# Patient Record
Sex: Female | Born: 1985 | Race: Black or African American | Hispanic: No | Smoking: Current every day smoker
Health system: Southern US, Community
[De-identification: ages and names within clinical notes are randomized; demographics above are authoritative.]

## PROBLEM LIST (undated history)

## (undated) DIAGNOSIS — T7840XA Allergy, unspecified, initial encounter: Secondary | ICD-10-CM

## (undated) DIAGNOSIS — F32A Depression, unspecified: Secondary | ICD-10-CM

## (undated) DIAGNOSIS — K219 Gastro-esophageal reflux disease without esophagitis: Secondary | ICD-10-CM

## (undated) DIAGNOSIS — F419 Anxiety disorder, unspecified: Secondary | ICD-10-CM

## (undated) DIAGNOSIS — F329 Major depressive disorder, single episode, unspecified: Secondary | ICD-10-CM

## (undated) HISTORY — DX: Major depressive disorder, single episode, unspecified: F32.9

## (undated) HISTORY — DX: Allergy, unspecified, initial encounter: T78.40XA

## (undated) HISTORY — DX: Depression, unspecified: F32.A

## (undated) HISTORY — DX: Anxiety disorder, unspecified: F41.9

## (undated) HISTORY — PX: OTHER SURGICAL HISTORY: SHX169

## (undated) HISTORY — DX: Gastro-esophageal reflux disease without esophagitis: K21.9

---
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
## 2007-03-03 LAB — HM PAP SMEAR

## 2011-02-17 ENCOUNTER — Ambulatory Visit: Payer: Self-pay

## 2011-02-17 DIAGNOSIS — N926 Irregular menstruation, unspecified: Secondary | ICD-10-CM

## 2011-02-17 DIAGNOSIS — F411 Generalized anxiety disorder: Secondary | ICD-10-CM

## 2011-02-21 ENCOUNTER — Encounter: Payer: Self-pay | Admitting: Physician Assistant

## 2011-02-21 DIAGNOSIS — F329 Major depressive disorder, single episode, unspecified: Secondary | ICD-10-CM | POA: Insufficient documentation

## 2011-03-18 ENCOUNTER — Telehealth: Payer: Self-pay

## 2011-03-18 NOTE — Telephone Encounter (Signed)
.  [facility] Michele Barker - pt states she is having panic attacks when coming down from the meds at the end of day.  She is taking adderol 2 times a day.  Please call  [phone]

## 2011-03-19 NOTE — Telephone Encounter (Signed)
Pt to call in 1 month with progress.

## 2011-03-19 NOTE — Telephone Encounter (Signed)
Spoke with pt.  Doing really well during the day with adderall at 8am and 12 noon but around 4pm she develops panic and has to take an ativan.  Less problems on the weekend because not at work.  Will increase Adderall to tid to see if panic resolves in the afternoon.

## 2011-03-27 ENCOUNTER — Encounter (HOSPITAL_COMMUNITY): Payer: Self-pay | Admitting: Emergency Medicine

## 2011-03-27 ENCOUNTER — Emergency Department (HOSPITAL_COMMUNITY)
Admission: EM | Admit: 2011-03-27 | Discharge: 2011-03-27 | Disposition: A | Discharge status: Home / Self Care | Payer: Self-pay | Attending: Emergency Medicine | Admitting: Emergency Medicine

## 2011-03-27 DIAGNOSIS — R220 Localized swelling, mass and lump, head: Secondary | ICD-10-CM | POA: Insufficient documentation

## 2011-03-27 DIAGNOSIS — F341 Dysthymic disorder: Secondary | ICD-10-CM | POA: Insufficient documentation

## 2011-03-27 DIAGNOSIS — J45909 Unspecified asthma, uncomplicated: Secondary | ICD-10-CM | POA: Insufficient documentation

## 2011-03-27 DIAGNOSIS — K219 Gastro-esophageal reflux disease without esophagitis: Secondary | ICD-10-CM | POA: Insufficient documentation

## 2011-03-27 DIAGNOSIS — Z79899 Other long term (current) drug therapy: Secondary | ICD-10-CM | POA: Insufficient documentation

## 2011-03-27 DIAGNOSIS — T7840XA Allergy, unspecified, initial encounter: Secondary | ICD-10-CM

## 2011-03-27 MED ORDER — DIPHENHYDRAMINE HCL 25 MG PO CAPS
25.0000 mg | ORAL_CAPSULE | Freq: Once | ORAL | Status: AC
  Administered 2011-03-27: 25 mg via ORAL
  Filled 2011-03-27: qty 1

## 2011-03-27 NOTE — ED Notes (Signed)
Pt states she woke up about an hour ago with hives and swelling noted to her lips  Pt states when she first woke up she had some shortness of breath  Pt states she has asthma and used her inhaler with relief  Pt denies difficulty swallowing or breathing at this time

## 2011-03-27 NOTE — ED Notes (Signed)
Pt states she was started on mouthwash periomed yesterday and used it for the first time last night prior to going to bed

## 2011-03-27 NOTE — ED Provider Notes (Signed)
History    26 year old female with a presumedallergic reaction. Before patient went to bed she used a PerioMed for the first time.  she was just prescribed this by her dentist.When went to sleep felt fine and then woke up and she felt like she her lower lip was swelling and her throat felt tight. Has a history of asthma and used her inhaler. Symptoms did  improve somewhat but after reading med instructions she she presented to the emergency department. Noticed a faint rash on her upper chest which has since resolved. No other complaints at this time. No difficulty swallowing. No nausea or vomiting.   CSN: [id number]  Arrival date & time 03/27/11  4098   First MD Initiated Contact with Patient 03/27/11 0402      Chief Complaint  Patient presents with  . Allergic Reaction    (Consider location/radiation/quality/duration/timing/severity/associated sxs/prior treatment) HPI  Past Medical History  Diagnosis Date  . GERD (gastroesophageal reflux disease)   . Anxiety and depression   . Asthma     Past Surgical History  Procedure Date  . Extraction of wisdom teeth     Family History  Problem Relation Age of Onset  . Depression Mother   . Hypothyroidism Mother   . Heart disease Father   . Coronary artery disease Other     History  Substance Use Topics  . Smoking status: Current Everyday Smoker -- 1.0 packs/day    Types: Cigarettes  . Smokeless tobacco: Not on file  . Alcohol Use: Yes     rare    OB History    Grav Para Term Preterm Abortions TAB SAB Ect Mult Living                  Review of Systems   Review of symptoms negative unless otherwise noted in HPI.   Allergies  Review of patient's allergies indicates no known allergies.  Home Medications   Current Outpatient Rx  Name Route Sig Dispense Refill  . ALBUTEROL SULFATE HFA 108 (90 BASE) MCG/ACT IN AERS Inhalation Inhale 2 puffs into the lungs every 6 (six) hours as needed.      .  AMPHETAMINE-DEXTROAMPHETAMINE 30 MG PO TABS Oral Take 30 mg by mouth 3 (three) times daily.     Marland Kitchen CITALOPRAM HYDROBROMIDE 40 MG PO TABS Oral Take 40 mg by mouth daily.      Marland Kitchen FEXOFENADINE HCL 180 MG PO TABS Oral Take 180 mg by mouth daily.    Marland Kitchen HYDROCODONE-ACETAMINOPHEN 5-500 MG PO TABS Oral Take 1 tablet by mouth every 6 (six) hours as needed. pain    . IMIPRAMINE HCL 50 MG PO TABS Oral Take 200 mg by mouth at bedtime.     Marland Kitchen LORAZEPAM 1 MG PO TABS Oral Take 1 mg by mouth every 6 (six) hours as needed. anxiety    . NORGESTIMATE-ETH ESTRADIOL 0.25-35 MG-MCG PO TABS Oral Take 1 tablet by mouth daily.      Marland Kitchen OMEPRAZOLE 40 MG PO CPDR Oral Take 40 mg by mouth daily as needed. gerd    . STANNOUS FLUORIDE 0.63 % MT CONC Mouth/Throat Use as directed 30 mLs in the mouth or throat at bedtime as needed. Mouth rinse      BP 148/93  Pulse 112  Temp(Src) 98.7 F (37.1 C) (Oral)  Resp 20  SpO2 99%  LMP 02/24/2011  Physical Exam  Nursing note and vitals reviewed. Constitutional: She appears well-developed. No distress.       Sitting  up in bed. No acute distress. Obese.  HENT:  Head: Normocephalic and atraumatic.  Mouth/Throat: Oropharynx is clear and moist.       Possibly mild swelling of the lower lip. Patient is handling her secretions. Posterior pharynx grossly normal in appearance. No stridor.  Eyes: Conjunctivae are normal. Right eye exhibits no discharge. Left eye exhibits no discharge.  Neck: Neck supple.  Cardiovascular: Normal rate, regular rhythm and normal heart sounds.  Exam reveals no gallop and no friction rub.   No murmur heard. Pulmonary/Chest: Effort normal and breath sounds normal. No respiratory distress. She has no wheezes.  Abdominal: Soft. She exhibits no distension. There is no tenderness.  Musculoskeletal: She exhibits no edema and no tenderness.  Neurological: She is alert.  Skin: Skin is warm and dry. She is not diaphoretic.  Psychiatric: She has a normal mood and  affect. Her behavior is normal. Thought content normal.    ED Course  Procedures (including critical care time)  Labs Reviewed - No data to display No results found.   1. Allergic reaction     5:05 AM Patient was reexamined. She has no new complaints. She states that she feels like her symptoms have been stable. Repeat examination has not significantly changed from initial.  MDM  26 year old female with a mild likely allergic reaction. There is no evidence respiratory distress. Patient was observed for a period of time in the emergency room without progression symptoms. She is hemodynamically stable. Discussed the need to stop taking this medication and discuss it with her prescribing provider. Return precautions discussed as well. Outpatient followup.        Raeford Razor, MD 03/27/11 [phone]

## 2011-03-31 ENCOUNTER — Telehealth: Payer: Self-pay

## 2011-03-31 NOTE — Telephone Encounter (Signed)
.  [facility]    PT IS REQUESTING REFILL FOR ADDERALL,ALSO HAS QUESTIONS  RE OTHER MEDS.   BEST PHONE [phone]

## 2011-04-01 NOTE — Telephone Encounter (Signed)
SPOKE WITH PT AND HER ADDERALL WAS INCREASED TO TID BUT WHEN THIS WAS DONE SHE STATRED GETTING PANIC ATTACKS AT THE END OF THE DAY. CAN SHE HAVE A REFILL ON HER ATIVAN TO HAVE IN CASE SHE CONTINUES TO HAVE THESE? PLEASE ADVISE

## 2011-04-02 NOTE — Telephone Encounter (Signed)
Please clarify - When I spoke with her 1/29 this was the problem she was having taking Adderall 2x/d so if she is still having the same problem with Adderall 3x/d maybe we should go to Adderall 2x/d and Klonopin at afternoon/at night?  I am happy to write her for her meds I am just trying to figure out what the best regimen is.

## 2011-04-02 NOTE — Telephone Encounter (Signed)
Spoke with pt and discussed Sarah's suggestion. Pt stated that the last couple of days she has not had any more panic attacks and seems to be fine on the 3 Adderalls a day without needing Ativan. Pt agrees to CB if she has any more problems, but she thinks she just had to adjust to the new dosage.

## 2011-04-02 NOTE — Telephone Encounter (Signed)
That is great news

## 2011-04-07 ENCOUNTER — Telehealth: Payer: Self-pay

## 2011-04-07 MED ORDER — AMPHETAMINE-DEXTROAMPHETAMINE 30 MG PO TABS: 30.0000 mg | ORAL_TABLET | Freq: Three times a day (TID) | ORAL | Status: DC

## 2011-04-07 NOTE — Telephone Encounter (Signed)
Written - will be up at TL desk signed.

## 2011-04-07 NOTE — Telephone Encounter (Signed)
.  [facility]    PT IS REQUESTING ADDERALL REFILL ,STATES SHE HAS REQUESTED MANY TIMES.    BEST PHONE  [phone]

## 2011-04-07 NOTE — Telephone Encounter (Signed)
Patient notified and rx in p/u drawer. 

## 2011-04-07 NOTE — Telephone Encounter (Signed)
Spoke with pt and apologized we didn't get Adderall Rx written last week when we d//w pt not needing ativan anymore. She was understanding, but has been out of adderall since Fri so would like to get the RF asap. Verified with pt that she is now taking Adderall 30 TID.

## 2011-05-01 ENCOUNTER — Telehealth: Payer: Self-pay

## 2011-05-01 NOTE — Telephone Encounter (Signed)
pls pull chart and send msg to pa pool

## 2011-05-01 NOTE — Telephone Encounter (Signed)
Chart pulled to PA 

## 2011-05-01 NOTE — Telephone Encounter (Signed)
.  [facility] PT IN NEED OF HER ADDERALL. PLEASE CALL [phone] WHEN READY

## 2011-05-02 MED ORDER — AMPHETAMINE-DEXTROAMPHETAMINE 30 MG PO TABS: 30.0000 mg | ORAL_TABLET | Freq: Three times a day (TID) | ORAL | Status: DC

## 2011-05-02 NOTE — Telephone Encounter (Signed)
Signed at TL desk.  

## 2011-05-02 NOTE — Telephone Encounter (Signed)
Pt notified that rx is ready for pick up

## 2011-05-22 ENCOUNTER — Telehealth: Payer: Self-pay

## 2011-05-22 NOTE — Telephone Encounter (Signed)
Michele Barker   Patient wants to discuss change of medications adavan vs adderall  No money for ov  Please call [phone]

## 2011-05-23 NOTE — Telephone Encounter (Signed)
Get info

## 2011-05-24 ENCOUNTER — Ambulatory Visit (INDEPENDENT_AMBULATORY_CARE_PROVIDER_SITE_OTHER): Payer: Self-pay | Admitting: Family Medicine

## 2011-05-24 VITALS — BP 140/88 | HR 96 | Temp 98.0°F | Resp 16 | Ht 65.75 in | Wt 220.6 lb

## 2011-05-24 DIAGNOSIS — F411 Generalized anxiety disorder: Secondary | ICD-10-CM

## 2011-05-24 DIAGNOSIS — F419 Anxiety disorder, unspecified: Secondary | ICD-10-CM

## 2011-05-24 MED ORDER — LORAZEPAM 1 MG PO TABS: ORAL_TABLET | ORAL | Status: DC

## 2011-05-24 NOTE — Telephone Encounter (Signed)
Spoke with patient, she states that over the last few weeks she has had an increase in stress both at work and in her personal life.  She would like to change back to Ativan to help with this and stop Adderall.  She is not able to RTC due to cost, can we rx this for her?

## 2011-05-24 NOTE — Telephone Encounter (Signed)
Needs OV (SW out of office on maternity leave)

## 2011-05-24 NOTE — Telephone Encounter (Signed)
Can we do acct agreement?

## 2011-05-24 NOTE — Telephone Encounter (Signed)
LMOM advising patient to RTC. 

## 2011-05-24 NOTE — Progress Notes (Signed)
Patient Name: Michele Barker Date of Birth: 06/08/1985 Medical Record Number: [id number] Gender: female Date of Encounter: 05/24/2011  History of Present Illness:  Michele Barker is a 26 y.o. very pleasant female patient who presents with the following:  Here to discuss her anxiety- she started adderall in December.  She felt that she was doing ok, but then over the last couple of weeks her anxiety has returned and she feels that her work pressures and home pressures are overwhelming her.  Even prior to this the adderall seemed to make her feel anxious- but it did help her do her job and concentrate.  She notes that she has cried a lot over the last couple of weeks.  She does have a history of depression that seemed to start when her father died a couple of years ago.    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    She has been on clonazepam in the past, also tried wellbutrin but she did not do well with this.  Ativan has helped her with depression and being "overly emotional."  She also has used celexa for some time  She is currently not on ativan at all- in the past she had been taking up to 4 pills daily.   She has no SI.  Not sleeping well- takes a sleep medication  Has menses currently  Patient Active Problem List  Diagnoses  . Anxiety and depression   Past Medical History  Diagnosis Date  . GERD (gastroesophageal reflux disease)   . Anxiety and depression   . Asthma    Past Surgical History  Procedure Date  . Extraction of wisdom teeth    History  Substance Use Topics  . Smoking status: Current Everyday Smoker -- 1.0 packs/day    Types: Cigarettes  . Smokeless tobacco: Not on file  . Alcohol Use: Yes     rare   Family History  Problem Relation Age of Onset  . Depression Mother   . Hypothyroidism Mother   . Heart disease Father   . Coronary artery disease Other    No Known Allergies  Medication list has been reviewed  and updated.  Review of Systems: As per HPI- otherwise negative.   Physical Examination: Filed Vitals:   05/24/11 1754  BP: 140/88  Pulse: 96  Temp: 98 F (36.7 C)  TempSrc: Oral  Resp: 16  Height: 5' 5.75" (1.67 m)  Weight: 220 lb 9.6 oz (100.064 kg)    Body mass index is 35.88 kg/(m^2).  GEN: WDWN, NAD, Non-toxic, A & O x 3, obese HEENT: Atraumatic, Normocephalic. Neck supple. No masses, No LAD. Ears and Nose: No external deformity. CV: RRR, No M/G/R. No JVD. No thrill. No extra heart sounds. PULM: CTA B, no wheezes, crackles, rhonchi. No retractions. No resp. distress. No accessory muscle use. EXTR: No c/c/e NEURO Normal gait.  PSYCH: Normally interactive. Conversant. Not depressed or anxious appearing.  Calm and pleasant demeanor.    Assessment and Plan: 1. Anxiety  LORazepam (ATIVAN) 1 MG tablet   Will start back on ativan for anxiety, but will try and use a lesser dose than she had in the past if possible.  Start one mg, 1/2 or 1 TID.  Let us know how she is doing in the next few weeks. She plans to stop taking adderall as this seems to make her feel worse.  Call if any other problems- otherwise may follow- up  by phone.

## 2011-05-24 NOTE — Telephone Encounter (Signed)
Yes, please offer account agreement so pt can be seen.

## 2011-05-30 ENCOUNTER — Telehealth: Payer: Self-pay

## 2011-05-30 NOTE — Telephone Encounter (Signed)
Pt in office last weekend and would like to see if it's ok with Dr Patsy Lager to take aderrall during the day and adavan in the evening plus she wants to stop taking her sleep medication and just use the adavan she says that it's working and wakes up happy.

## 2011-06-02 MED ORDER — AMPHETAMINE-DEXTROAMPHETAMINE 30 MG PO TABS: 30.0000 mg | ORAL_TABLET | Freq: Three times a day (TID) | ORAL | Status: DC

## 2011-06-02 NOTE — Telephone Encounter (Signed)
Please call patient- she can probably use whichever combination seems to help her.  I do not really understand this message- please get details.  Thanks!

## 2011-06-02 NOTE — Telephone Encounter (Signed)
PT IN NEED OF HER ADDERALL. PLEASE CALL [phone]

## 2011-06-02 NOTE — Telephone Encounter (Signed)
Notified pt Rx is ready for p/up, and clarified that the pt wanted to let Dr Patsy Lager know that the Ativan is working well for her for sleep and not needing sleep med too.

## 2011-06-02 NOTE — Telephone Encounter (Signed)
Rx printed

## 2011-06-19 ENCOUNTER — Other Ambulatory Visit: Payer: Self-pay | Admitting: Physician Assistant

## 2011-06-20 ENCOUNTER — Ambulatory Visit: Payer: Self-pay | Admitting: Internal Medicine

## 2011-06-20 VITALS — BP 124/86 | HR 112 | Temp 98.2°F | Resp 16 | Ht 66.0 in | Wt 216.0 lb

## 2011-06-20 DIAGNOSIS — F419 Anxiety disorder, unspecified: Secondary | ICD-10-CM

## 2011-06-20 DIAGNOSIS — J9801 Acute bronchospasm: Secondary | ICD-10-CM

## 2011-06-20 MED ORDER — PREDNISONE 20 MG PO TABS: ORAL_TABLET | ORAL | Status: DC

## 2011-06-20 MED ORDER — LORAZEPAM 1 MG PO TABS: ORAL_TABLET | ORAL | Status: DC

## 2011-06-20 MED ORDER — AMOXICILLIN 875 MG PO TABS: 875.0000 mg | ORAL_TABLET | Freq: Two times a day (BID) | ORAL | Status: AC

## 2011-06-20 MED ORDER — ALBUTEROL SULFATE HFA 108 (90 BASE) MCG/ACT IN AERS: 2.0000 | INHALATION_SPRAY | RESPIRATORY_TRACT | Status: DC | PRN

## 2011-06-20 NOTE — Progress Notes (Signed)
  Subjective:    Patient ID: Michele Barker, female    DOB: 05/09/1985, 26 y.o.   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 albuterol (PROAIR HFA) 108 (90 BASE) MCG/ACT inhaler    Sig: Inhale 2 puffs into the lungs every 4 (four) hours as needed for wheezing.    Dispense:  1 Inhaler    Refill:  5  . LORazepam (ATIVAN) 1 MG tablet    Sig: Take 1/2 or one tablet TID as needed for anxiety    Dispense:  90 tablet    Refill:  2  . predniSONE (DELTASONE) 20 MG tablet    Sig: 4/3/3/2/2/1/1 single daily dose for 7 days    Dispense:  16 tablet    Refill:  0  . amoxicillin (AMOXIL) 875 MG tablet    Sig: Take 1 tablet (875 mg total) by mouth 2 (two) times daily.    Dispense:  20 tablet    Refill:  0   She will hold the amoxicillin and not use it unless her status infection develops over the next 4 or 5 days as her allergy responds to prednisone/Continue Allegra/stop smoking

## 2011-06-27 ENCOUNTER — Telehealth: Payer: Self-pay

## 2011-06-27 MED ORDER — AMPHETAMINE-DEXTROAMPHETAMINE 30 MG PO TABS: 30.0000 mg | ORAL_TABLET | Freq: Three times a day (TID) | ORAL | Status: DC

## 2011-06-27 NOTE — Telephone Encounter (Signed)
Spoke with patient and let her know that rx was ready for pick up.

## 2011-06-27 NOTE — Telephone Encounter (Signed)
Pt would like a refill on adderall. 

## 2011-06-27 NOTE — Telephone Encounter (Signed)
Done and ready for pick up. May fill on 07/02/11

## 2011-07-29 ENCOUNTER — Telehealth: Payer: Self-pay

## 2011-07-29 NOTE — Telephone Encounter (Signed)
PT IN NEED OF HER ADDERALL. PLEASE CALL [phone] WHEN READY FOR P/U

## 2011-07-30 MED ORDER — AMPHETAMINE-DEXTROAMPHETAMINE 30 MG PO TABS: 30.0000 mg | ORAL_TABLET | Freq: Three times a day (TID) | ORAL | Status: DC

## 2011-07-30 NOTE — Telephone Encounter (Signed)
rx is ready

## 2011-07-30 NOTE — Telephone Encounter (Signed)
Left message on machine that medicine is ready to be picked up

## 2011-08-19 ENCOUNTER — Telehealth: Payer: Self-pay

## 2011-08-19 MED ORDER — NORGESTIMATE-ETH ESTRADIOL 0.25-35 MG-MCG PO TABS: 1.0000 | ORAL_TABLET | Freq: Every day | ORAL | Status: DC

## 2011-08-19 NOTE — Telephone Encounter (Signed)
Spoke with pt and advised that rx sent in and will need ov.  Pt understood and will be coming in.

## 2011-08-19 NOTE — Telephone Encounter (Signed)
The patient called in to request refill of BCP Sprintec be called in to her pharmacy today, since she has no pill for today and the pharmacy stated she has no refills left.  The patient uses the [facility] pharmacy on [address].  Please call in Rx and call patient at  [phone] once the Rx has been called in to the pharmacy.

## 2011-08-19 NOTE — Telephone Encounter (Signed)
Done and sent in. Needs ov

## 2011-08-31 ENCOUNTER — Ambulatory Visit: Payer: Self-pay | Admitting: Family Medicine

## 2011-08-31 ENCOUNTER — Other Ambulatory Visit: Payer: Self-pay | Admitting: Physician Assistant

## 2011-08-31 VITALS — BP 135/85 | HR 81 | Temp 98.3°F | Resp 18 | Ht 66.0 in | Wt 219.6 lb

## 2011-08-31 DIAGNOSIS — Z3009 Encounter for other general counseling and advice on contraception: Secondary | ICD-10-CM

## 2011-08-31 DIAGNOSIS — J45909 Unspecified asthma, uncomplicated: Secondary | ICD-10-CM

## 2011-08-31 DIAGNOSIS — F988 Other specified behavioral and emotional disorders with onset usually occurring in childhood and adolescence: Secondary | ICD-10-CM

## 2011-08-31 MED ORDER — MOMETASONE FURO-FORMOTEROL FUM 100-5 MCG/ACT IN AERO: 2.0000 | INHALATION_SPRAY | Freq: Two times a day (BID) | RESPIRATORY_TRACT | Status: DC

## 2011-08-31 MED ORDER — PREDNISONE 20 MG PO TABS: ORAL_TABLET | ORAL | Status: DC

## 2011-08-31 MED ORDER — ALBUTEROL SULFATE HFA 108 (90 BASE) MCG/ACT IN AERS: 2.0000 | INHALATION_SPRAY | RESPIRATORY_TRACT | Status: DC | PRN

## 2011-08-31 MED ORDER — NORGESTIMATE-ETH ESTRADIOL 0.25-35 MG-MCG PO TABS: 1.0000 | ORAL_TABLET | Freq: Every day | ORAL | Status: DC

## 2011-08-31 MED ORDER — AMPHETAMINE-DEXTROAMPHETAMINE 30 MG PO TABS: 30.0000 mg | ORAL_TABLET | Freq: Three times a day (TID) | ORAL | Status: DC

## 2011-08-31 NOTE — Patient Instructions (Signed)
Return to the clinic or go to the nearest emergency room if any of your symptoms worsen or new symptoms occur. Schedule office visit with Michele Barker in the next few months.

## 2011-08-31 NOTE — Progress Notes (Signed)
  Subjective:    Patient ID: Michele Barker, female    DOB: 03-May-1985, 26 y.o.   MRN: [id number]  HPI Michele Barker is a 26 y.o. female Hx of asthma - on proair - most days once per day, then with flairs - uses up to every 4 hours -using every 4 hours for past 5 days.  Ran out of inhaler this am - wheezing and shortness of breath.  No fever.  Just wheeze, cough with clear sputum, allergies flared up.   Taking allegra otc QD.  Prior on Qvar - stopped over a year ago as felt like didn't need it. ON prednisone few months ago with last flair.  No hx of psychosis or anxiety changes with prednisone.   Out of Adderall - takes 30mg  tid. Last filled over a month. No extra doses. Working well.  Appetite ok recently. No insomnia.    About to be out of birth control.  Last pap in 2009, but plans on physical in next few months.  Unable to afford physical, or pap testing today. Needs rx Sprintec.  No missed doses.   Usually sees Benny Lennert, does not have appt setup.    Lost job June 14th.   Working on quitting smoking - less than 5 cigarettes per day.   Review of Systems  Constitutional: Negative for fever and chills.  Respiratory: Positive for cough (clear sputum.), shortness of breath and wheezing (worse this am - panicking  - unable to breathe. ).       Objective:   Physical Exam  Constitutional: She is oriented to person, place, and time. She appears well-developed and well-nourished. No distress.  HENT:  Head: Normocephalic and atraumatic.  Right Ear: Hearing, tympanic membrane, external ear and ear canal normal.  Left Ear: Hearing, tympanic membrane, external ear and ear canal normal.  Nose: Nose normal.  Mouth/Throat: Oropharynx is clear and moist. No oropharyngeal exudate.  Eyes: Conjunctivae and EOM are normal. Pupils are equal, round, and reactive to light.  Cardiovascular: Normal rate, regular rhythm, normal heart sounds and intact distal pulses.   No murmur heard. Pulmonary/Chest:  Effort normal. No respiratory distress. She has no decreased breath sounds. She has wheezes. She has no rhonchi. She has no rales.       Normal effort, no distress,  Few scattered end expiratory wheezes.     Neurological: She is alert and oriented to person, place, and time.  Skin: Skin is warm and dry. No rash noted.  Psychiatric: She has a normal mood and affect. Her behavior is normal.          Assessment & Plan:  Michele Barker is a 26 y.o. female 1. Asthma   2. ADD (attention deficit disorder)   3. Counseling for birth control, oral contraceptives     Asthma - uncontrolled with recent flairs, and continued use of albuterol.  Prednisone 20mg  - 2 QD for 5 days. Start Dulera 100/5 - 2 puffs BID.  # 1 rx, 1 refill.  Coupon given for initial Dulera, but then if cost prohibitive for refill can change to Qvar (coupon also given).  RTC precautions. SED of prednisone discusssed.  Understanding expressed.  ADD - controlled.  Continue adderall 30mg  TID - ok to refill x 2 then ov with Benny Lennert, PA-C.   Contraceptive counseling - refilled sprintec for 6 months, but stressed importance of pap testing - plans to schedule next few months.

## 2011-09-20 ENCOUNTER — Other Ambulatory Visit: Payer: Self-pay | Admitting: Physician Assistant

## 2011-09-30 ENCOUNTER — Telehealth: Payer: Self-pay

## 2011-09-30 MED ORDER — AMPHETAMINE-DEXTROAMPHETAMINE 30 MG PO TABS: 30.0000 mg | ORAL_TABLET | Freq: Three times a day (TID) | ORAL | Status: DC

## 2011-09-30 NOTE — Telephone Encounter (Signed)
Pt requesting adderall refill   Best phone [phone]

## 2011-09-30 NOTE — Telephone Encounter (Signed)
Done and printed

## 2011-09-30 NOTE — Telephone Encounter (Signed)
Left message that rx ready for pick up and at front desk.

## 2011-11-03 ENCOUNTER — Telehealth: Payer: Self-pay

## 2011-11-03 MED ORDER — AMPHETAMINE-DEXTROAMPHETAMINE 30 MG PO TABS: 30.0000 mg | ORAL_TABLET | Freq: Three times a day (TID) | ORAL | Status: DC

## 2011-11-03 NOTE — Telephone Encounter (Signed)
PT IN NEED OF HER ADDERALL. PLEASE CALL [phone] WHEN READY

## 2011-11-03 NOTE — Telephone Encounter (Signed)
ADD - controlled. Continue adderall 30mg  TID - ok to refill x 2 then ov with Benny Lennert, PA-C. This was in July when pt was seen for other issues, patient now due, need to advise patient.

## 2011-11-03 NOTE — Telephone Encounter (Signed)
Rx done and ready to pickup.  She may call for 1 more and then needs follow up OV with Maralyn Sago.

## 2011-11-03 NOTE — Telephone Encounter (Signed)
Patient notified and rx in pickup drawer. 

## 2011-11-04 ENCOUNTER — Other Ambulatory Visit: Payer: Self-pay | Admitting: Family Medicine

## 2011-11-04 DIAGNOSIS — F419 Anxiety disorder, unspecified: Secondary | ICD-10-CM

## 2011-11-04 MED ORDER — LORAZEPAM 1 MG PO TABS: ORAL_TABLET | ORAL | Status: DC

## 2011-11-26 ENCOUNTER — Ambulatory Visit: Payer: BC Managed Care – PPO | Admitting: Family Medicine

## 2011-11-26 VITALS — BP 110/78 | HR 88 | Temp 98.7°F | Resp 16 | Ht 66.3 in | Wt 237.6 lb

## 2011-11-26 DIAGNOSIS — J40 Bronchitis, not specified as acute or chronic: Secondary | ICD-10-CM

## 2011-11-26 MED ORDER — CEFDINIR 300 MG PO CAPS: 300.0000 mg | ORAL_CAPSULE | Freq: Two times a day (BID) | ORAL | Status: DC

## 2011-11-26 MED ORDER — ALBUTEROL SULFATE HFA 108 (90 BASE) MCG/ACT IN AERS: 2.0000 | INHALATION_SPRAY | RESPIRATORY_TRACT | Status: DC | PRN

## 2011-11-26 MED ORDER — HYDROCODONE-HOMATROPINE 5-1.5 MG/5ML PO SYRP: 5.0000 mL | ORAL_SOLUTION | Freq: Three times a day (TID) | ORAL | Status: DC | PRN

## 2011-11-26 NOTE — Progress Notes (Signed)
[facility] [address] [place] [address] [phone]  Date:  11/26/2011   Name:  Michele Barker   DOB:  12-Jul-1985   MRN:  [id number]  PCP:  Virgilio Belling    Chief Complaint: Bronchitis and Medication Refill   History of Present Illness:  Michele Barker is a 26 y.o. very pleasant female patient who presents with the following:  She had a cold about 3 weeks ago.  Got mostly better, but she continues to have a cough at night that can be painful.  Sometimes she coughs up mucus.  She also has a ST for the last couple of days.   She felt feverish a few days ago- not anymore.   Ears feel full and congested.   No GI symptoms. She does have some body aches, but no chills recently.   She has not slept well because of the cough.    She is generally healthy except she does have some asthma.  However, she has not wheezed in several days.   LMP 11/12/11  Patient Active Problem List  Diagnosis  . Anxiety and depression    Past Medical History  Diagnosis Date  . GERD (gastroesophageal reflux disease)   . Anxiety and depression   . Asthma     Past Surgical History  Procedure Date  . Extraction of wisdom teeth     History  Substance Use Topics  . Smoking status: Current Every Day Smoker -- 1.0 packs/day    Types: Cigarettes  . Smokeless tobacco: Not on file  . Alcohol Use: Yes     rare    Family History  Problem Relation Age of Onset  . Depression Mother   . Hypothyroidism Mother   . Heart disease Father   . Coronary artery disease Other     No Known Allergies  Medication list has been reviewed and updated.  Current Outpatient Prescriptions on File Prior to Visit  Medication Sig Dispense Refill  . albuterol (PROAIR HFA) 108 (90 BASE) MCG/ACT inhaler Inhale 2 puffs into the lungs every 4 (four) hours as needed for wheezing.  1 Inhaler  0  . amphetamine-dextroamphetamine (ADDERALL) 30 MG tablet Take 1 tablet (30 mg total) by mouth 3  (three) times daily.  90 tablet  0  . citalopram (CELEXA) 40 MG tablet TAKE ONE TABLET BY MOUTH ONE TIME DAILY  90 tablet  0  . fexofenadine (ALLEGRA) 180 MG tablet Take 180 mg by mouth daily.      Marland Kitchen LORazepam (ATIVAN) 1 MG tablet Take 1/2 or one tablet TID as needed for anxiety  90 tablet  0  . mometasone-formoterol (DULERA) 100-5 MCG/ACT AERO Inhale 2 puffs into the lungs 2 (two) times daily.  13 g  1  . norgestimate-ethinyl estradiol (ORTHO-CYCLEN,SPRINTEC,PREVIFEM) 0.25-35 MG-MCG tablet Take 1 tablet by mouth daily. Needs office visit  1 Package  5  . omeprazole (PRILOSEC) 40 MG capsule Take 40 mg by mouth daily as needed. gerd      . imipramine (TOFRANIL) 50 MG tablet Take 200 mg by mouth at bedtime.       . predniSONE (DELTASONE) 20 MG tablet 2 pills per day for 5 days.  10 tablet  0    Review of Systems:  As per HPI- otherwise negative.   Physical Examination: Filed Vitals:   11/26/11 1619  BP: 110/78  Pulse: 88  Temp: 98.7 F (37.1 C)  Resp: 16   Filed Vitals:  11/26/11 1619  Height: 5' 6.3" (1.684 m)  Weight: 237 lb 9.6 oz (107.775 kg)   Body mass index is 38.00 kg/(m^2). Ideal Body Weight: Weight in (lb) to have BMI = 25: 156   GEN: WDWN, NAD, Non-toxic, A & O x 3 HEENT: Atraumatic, Normocephalic. Neck supple. No masses, No LAD. Bilateral TM wnl, oropharynx normal.  PEERL,EOMI.   Nasal cavity normal Ears and Nose: No external deformity. CV: RRR, No M/G/R. No JVD. No thrill. No extra heart sounds. PULM: CTA B, no wheezes, crackles, rhonchi. No retractions. No resp. distress. No accessory muscle use. ABD: S, NT, ND, +BS. No rebound. No HSM. EXTR: No c/c/e NEURO Normal gait.  PSYCH: Normally interactive. Conversant. Not depressed or anxious appearing.  Calm demeanor.    Assessment and Plan: 1. Bronchitis  cefdinir (OMNICEF) 300 MG capsule, HYDROcodone-homatropine (HYCODAN) 5-1.5 MG/5ML syrup, albuterol (PROAIR HFA) 108 (90 BASE) MCG/ACT inhaler   Treat for  bronchitis as above.  Avoid combining hycodan with other sedating medications such as ativan.  omnicef as directed.  Be aware that this antibiotic could interfere with her OCP.  Let me know if not better in the next couple of days- Sooner if worse.     Abbe Amsterdam, MD

## 2011-11-29 ENCOUNTER — Ambulatory Visit (INDEPENDENT_AMBULATORY_CARE_PROVIDER_SITE_OTHER): Payer: BC Managed Care – PPO | Admitting: Physician Assistant

## 2011-11-29 VITALS — BP 110/60 | HR 100 | Temp 98.1°F | Resp 16 | Ht 66.18 in | Wt 230.6 lb

## 2011-11-29 DIAGNOSIS — R300 Dysuria: Secondary | ICD-10-CM

## 2011-11-29 DIAGNOSIS — R197 Diarrhea, unspecified: Secondary | ICD-10-CM

## 2011-11-29 DIAGNOSIS — N760 Acute vaginitis: Secondary | ICD-10-CM

## 2011-11-29 DIAGNOSIS — N390 Urinary tract infection, site not specified: Secondary | ICD-10-CM

## 2011-11-29 DIAGNOSIS — N898 Other specified noninflammatory disorders of vagina: Secondary | ICD-10-CM

## 2011-11-29 LAB — POCT URINALYSIS DIPSTICK
Glucose, UA: NEGATIVE
Nitrite, UA: NEGATIVE
Protein, UA: 30
Urobilinogen, UA: 0.2

## 2011-11-29 LAB — POCT UA - MICROSCOPIC ONLY
Casts, Ur, LPF, POC: NEGATIVE
Crystals, Ur, HPF, POC: NEGATIVE
Yeast, UA: NEGATIVE

## 2011-11-29 LAB — POCT WET PREP WITH KOH
KOH Prep POC: NEGATIVE
Trichomonas, UA: NEGATIVE

## 2011-11-29 MED ORDER — METRONIDAZOLE 500 MG PO TABS: 500.0000 mg | ORAL_TABLET | Freq: Two times a day (BID) | ORAL | Status: AC

## 2011-11-29 MED ORDER — PHENAZOPYRIDINE HCL 200 MG PO TABS: 200.0000 mg | ORAL_TABLET | Freq: Three times a day (TID) | ORAL | Status: DC | PRN

## 2011-11-29 MED ORDER — FLUCONAZOLE 150 MG PO TABS: 150.0000 mg | ORAL_TABLET | Freq: Once | ORAL | Status: DC

## 2011-11-29 MED ORDER — SULFAMETHOXAZOLE-TRIMETHOPRIM 800-160 MG PO TABS: 1.0000 | ORAL_TABLET | Freq: Two times a day (BID) | ORAL | Status: AC

## 2011-11-29 NOTE — Progress Notes (Signed)
[address] [place] [address]   Phone [phone]  Subjective:    Patient ID: Michele Barker, female    DOB: [birth year], 26 y.o.   MRN: [id number]  HPI Pt presents to clinic with vaginal irritation, dysuria and diarrhea.  The symptoms all started about the same time after starting Omnicef for bronchitis.  She feels better with her bronchitis.  She has loose frequent stools without blood.  It hurts when she urinates and she is having frequency.  She also is irritated in her vaginal area.  No new sexual contacts.  She is otherwise doing well.  Less stress with her new job.  Still living with her mom but they are getting ready to look for a different apartment.   Review of Systems  Constitutional: Negative.   Gastrointestinal: Positive for diarrhea. Negative for abdominal pain, constipation and blood in stool.  Genitourinary: Positive for dysuria, frequency, vaginal discharge and vaginal pain. Negative for urgency, hematuria and difficulty urinating.       Objective:   Physical Exam  Vitals reviewed. Constitutional: She is oriented to person, place, and time. She appears well-developed and well-nourished.  HENT:  Head: Normocephalic and atraumatic.  Right Ear: External ear normal.  Left Ear: External ear normal.  Eyes: Conjunctivae normal are normal. Pupils are equal, round, and reactive to light.  Cardiovascular: Normal rate, regular rhythm and normal heart sounds.   Pulmonary/Chest: Effort normal. She has wheezes (few at bases only and only end expiration).  Abdominal: Soft. Bowel sounds are normal. She exhibits no distension. There is no tenderness. There is no CVA tenderness.  Genitourinary: Pelvic exam was performed with patient supine. There is rash (mild erythema) on the right labia. There is no tenderness, lesion or injury on the right labia. There is rash (mild erythema) on the left labia. There is no tenderness, lesion or injury on the left labia. Cervix exhibits no  discharge and no friability. No erythema around the vagina. Vaginal discharge (thick white d/c) found.  Neurological: She is alert and oriented to person, place, and time.  Skin: Skin is warm and dry.  Psychiatric: She has a normal mood and affect. Her behavior is normal. Judgment and thought content normal.   Results for orders placed in visit on 11/29/11  POCT UA - MICROSCOPIC ONLY      Component Value Range   WBC, Ur, HPF, POC tntc     RBC, urine, microscopic tntc     Bacteria, U Microscopic 3+     Mucus, UA pos     Epithelial cells, urine per micros 4-7     Crystals, Ur, HPF, POC neg     Casts, Ur, LPF, POC neg     Yeast, UA neg    POCT URINALYSIS DIPSTICK      Component Value Range   Color, UA yellow     Clarity, UA cloudy     Glucose, UA neg     Bilirubin, UA neg     Ketones, UA 15     Spec Grav, UA >=1.030     Blood, UA trace     pH, UA 6.0     Protein, UA 30     Urobilinogen, UA 0.2     Nitrite, UA neg     Leukocytes, UA Trace    POCT WET PREP WITH KOH      Component Value Range   Trichomonas, UA Negative     Clue Cells Wet Prep HPF POC tntc  Epithelial Wet Prep HPF POC neg     Yeast Wet Prep HPF POC neg     Bacteria Wet Prep HPF POC 3+     RBC Wet Prep HPF POC 3-6     WBC Wet Prep HPF POC tntc     KOH Prep POC Negative          Assessment & Plan:   1. Dysuria  POCT UA - Microscopic Only, POCT urinalysis dipstick, POCT Wet Prep with KOH  2. Vaginal discharge  fluconazole (DIFLUCAN) 150 MG tablet  3. Diarrhea    4. UTI (lower urinary tract infection)  sulfamethoxazole-trimethoprim (BACTRIM DS,SEPTRA DS) 800-160 MG per tablet, phenazopyridine (PYRIDIUM) 200 MG tablet, fluconazole (DIFLUCAN) 150 MG tablet, Urine culture  5. BV (bacterial vaginosis)  metroNIDAZOLE (FLAGYL) 500 MG tablet   1-  2- todays vaginal irritation is from BV but concerned with abx use pt will develop yeast vaginitis so will send pt with a rx for treatment. 3- SE from [place].  D/w  pt that she may get some with new abx also. 4- treat with abx - push fluids 5- treat with abx  Due to pts SE of diarrhea from [facility] and the general poor coverage for UTI will switch to Septra DS which should cover UTI and bronchitis.  D/w pt possible SE and she agreed to the change in abx.

## 2011-11-30 LAB — URINE CULTURE: Colony Count: 1000

## 2011-12-04 ENCOUNTER — Ambulatory Visit (INDEPENDENT_AMBULATORY_CARE_PROVIDER_SITE_OTHER): Payer: BC Managed Care – PPO | Admitting: Emergency Medicine

## 2011-12-04 VITALS — BP 124/82 | HR 98 | Temp 98.2°F | Resp 18 | Ht 65.5 in | Wt 223.4 lb

## 2011-12-04 DIAGNOSIS — N730 Acute parametritis and pelvic cellulitis: Secondary | ICD-10-CM

## 2011-12-04 DIAGNOSIS — E669 Obesity, unspecified: Secondary | ICD-10-CM | POA: Insufficient documentation

## 2011-12-04 DIAGNOSIS — F17200 Nicotine dependence, unspecified, uncomplicated: Secondary | ICD-10-CM

## 2011-12-04 DIAGNOSIS — F419 Anxiety disorder, unspecified: Secondary | ICD-10-CM

## 2011-12-04 DIAGNOSIS — K219 Gastro-esophageal reflux disease without esophagitis: Secondary | ICD-10-CM

## 2011-12-04 DIAGNOSIS — Z720 Tobacco use: Secondary | ICD-10-CM | POA: Insufficient documentation

## 2011-12-04 DIAGNOSIS — J45909 Unspecified asthma, uncomplicated: Secondary | ICD-10-CM

## 2011-12-04 MED ORDER — CEFTRIAXONE SODIUM 1 G IJ SOLR: 500.0000 mg | Freq: Once | INTRAMUSCULAR | Status: DC

## 2011-12-04 MED ORDER — LORAZEPAM 1 MG PO TABS: ORAL_TABLET | ORAL | Status: DC

## 2011-12-04 MED ORDER — DOXYCYCLINE HYCLATE 100 MG PO TABS: 100.0000 mg | ORAL_TABLET | Freq: Two times a day (BID) | ORAL | Status: DC

## 2011-12-04 MED ORDER — CEFTRIAXONE SODIUM 1 G IJ SOLR
500.0000 mg | Freq: Once | INTRAMUSCULAR | Status: AC
  Administered 2011-12-04: 500 mg via INTRAMUSCULAR

## 2011-12-04 NOTE — Progress Notes (Signed)
[facility] [address] [place] [address] [phone]  Date:  12/04/2011   Name:  Michele Barker   DOB:  12/29/1985   MRN:  [id number]  PCP:  Virgilio Belling    Chief Complaint: Follow-up   History of Present Illness:  Michele Barker is a 26 y.o. very pleasant female patient who presents with the following:  Seen thrice this week for bronchitis and BV.  Says her GYN complaints have worsened.  Says that she has a yellow-bloody discharge associated with dysuria and continuous pain as in "raw".  Has dyspareunia for months.  Wants a check for STD as well. Denies fever or chills, nausea or vomiting.  No stool change.  Frequency and urgency have resolve as have her URI symptoms.  Patient Active Problem List  Diagnosis  . Anxiety and depression  . Obesity  . GERD (gastroesophageal reflux disease)  . Tobacco abuse  . Asthma    Past Medical History  Diagnosis Date  . GERD (gastroesophageal reflux disease)   . Anxiety and depression   . Asthma     Past Surgical History  Procedure Date  . Extraction of wisdom teeth     History  Substance Use Topics  . Smoking status: Current Every Day Smoker -- 1.0 packs/day    Types: Cigarettes  . Smokeless tobacco: Not on file  . Alcohol Use: Yes     rare    Family History  Problem Relation Age of Onset  . Depression Mother   . Hypothyroidism Mother   . Heart disease Father   . Coronary artery disease Other     No Known Allergies  Medication list has been reviewed and updated.  Current Outpatient Prescriptions on File Prior to Visit  Medication Sig Dispense Refill  . albuterol (PROAIR HFA) 108 (90 BASE) MCG/ACT inhaler Inhale 2 puffs into the lungs every 4 (four) hours as needed for wheezing.  1 Inhaler  6  . amphetamine-dextroamphetamine (ADDERALL) 30 MG tablet Take 1 tablet (30 mg total) by mouth 3 (three) times daily.  90 tablet  0  . citalopram (CELEXA) 40 MG tablet TAKE ONE TABLET BY MOUTH  ONE TIME DAILY  90 tablet  0  . fexofenadine (ALLEGRA) 180 MG tablet Take 180 mg by mouth daily.      Marland Kitchen HYDROcodone-homatropine (HYCODAN) 5-1.5 MG/5ML syrup Take 5 mLs by mouth every 8 (eight) hours as needed for cough.  90 mL  0  . LORazepam (ATIVAN) 1 MG tablet Take 1/2 or one tablet TID as needed for anxiety  90 tablet  0  . metroNIDAZOLE (FLAGYL) 500 MG tablet Take 1 tablet (500 mg total) by mouth 2 (two) times daily.  14 tablet  0  . mometasone-formoterol (DULERA) 100-5 MCG/ACT AERO Inhale 2 puffs into the lungs 2 (two) times daily.  13 g  1  . norgestimate-ethinyl estradiol (ORTHO-CYCLEN,SPRINTEC,PREVIFEM) 0.25-35 MG-MCG tablet Take 1 tablet by mouth daily. Needs office visit  1 Package  5  . omeprazole (PRILOSEC) 40 MG capsule Take 40 mg by mouth daily as needed. gerd      . phenazopyridine (PYRIDIUM) 200 MG tablet Take 1 tablet (200 mg total) by mouth 3 (three) times daily as needed for pain.  10 tablet  0  . sulfamethoxazole-trimethoprim (BACTRIM DS,SEPTRA DS) 800-160 MG per tablet Take 1 tablet by mouth 2 (two) times daily.  14 tablet  0  . cefdinir (OMNICEF) 300 MG capsule Take 1 capsule (300 mg  total) by mouth 2 (two) times daily.  20 capsule  0  . fluconazole (DIFLUCAN) 150 MG tablet Take 1 tablet (150 mg total) by mouth once.  2 tablet  0    Review of Systems:  As per HPI, otherwise negative.    Physical Examination: Filed Vitals:   12/04/11 0744  BP: 124/82  Pulse: 98  Temp: 98.2 F (36.8 C)  Resp: 18   Filed Vitals:   12/04/11 0744  Height: 5' 5.5" (1.664 m)  Weight: 223 lb 6.4 oz (101.334 kg)   Body mass index is 36.61 kg/(m^2). Ideal Body Weight: Weight in (lb) to have BMI = 25: 152.2   GEN: WDWN, NAD, Non-toxic, A & O x 3 HEENT: Atraumatic, Normocephalic. Neck supple. No masses, No LAD. Ears and Nose: No external deformity. CV: RRR, No M/G/R. No JVD. No thrill. No extra heart sounds. PULM: CTA B, no wheezes, crackles, rhonchi. No retractions. No resp.  distress. No accessory muscle use. ABD: S, NT, ND, +BS. No rebound. No HSM. EXTR: No c/c/e NEURO Normal gait.  PSYCH: Normally interactive. Conversant. Not depressed or anxious appearing.  Calm demeanor.  Pelvic - normal external genitalia, vulva, vagina,  Tender cervix with brown exudate, uterus and adnexa cultures taken   Assessment and Plan: PID Doxycycline Rocephin Stop smoking and lose weight Follow up as needed  Carmelina Dane, MD  I have reviewed and agree with documentation. Robert P. Merla Riches, M.D.

## 2011-12-23 ENCOUNTER — Other Ambulatory Visit: Payer: Self-pay | Admitting: Physician Assistant

## 2012-01-01 ENCOUNTER — Other Ambulatory Visit: Payer: Self-pay | Admitting: Family Medicine

## 2012-01-06 ENCOUNTER — Telehealth: Payer: Self-pay

## 2012-01-06 NOTE — Telephone Encounter (Signed)
Pt called and wants to refill for Ativan 1mg , 1/2 to 1 tablet 3 times daily.  [facility] corner of [address].    Best number [phone].

## 2012-01-06 NOTE — Telephone Encounter (Signed)
Pharmacy requesting refill on Ativan 1mg . Last filled on 12/04/11.

## 2012-01-07 ENCOUNTER — Telehealth: Payer: Self-pay | Admitting: Family Medicine

## 2012-01-07 DIAGNOSIS — F419 Anxiety disorder, unspecified: Secondary | ICD-10-CM

## 2012-01-07 MED ORDER — LORAZEPAM 1 MG PO TABS: ORAL_TABLET | ORAL | Status: DC

## 2012-01-07 NOTE — Telephone Encounter (Signed)
Pt called- wondering why refill hasn't been done. Explained to him that the message was in Dr. Ewell Poe box to review- she stated that Benny Lennert is her PCP. I told her I would forward her the message. Elisabeth Most Davette, CMA 01/06/2012 1:56 PM Signed  Pharmacy requesting refill on Ativan 1mg . Last filled on 12/04/11. Buena Irish 01/06/2012 1:25 PM Signed  Pt called and wants to refill for Ativan 1mg , 1/2 to 1 tablet 3 times daily. [facility] corner of [address].  Best number [phone].

## 2012-01-07 NOTE — Telephone Encounter (Signed)
Pt stated she never discussed stopping her adderall but she will be in to discuss anxiety and depression issues.

## 2012-01-07 NOTE — Telephone Encounter (Signed)
I reviewed her records and it looks like in April she and Dr Patsy Lager discussed her stopping the adderall but I see that she has still been getting it.  I am ok to refill this but I think that we really need to address her anxiety and depression because this is a large dose and we do not want to use it long time.  At TL desk.

## 2012-01-08 ENCOUNTER — Other Ambulatory Visit: Payer: Self-pay | Admitting: Family Medicine

## 2012-02-06 ENCOUNTER — Ambulatory Visit (INDEPENDENT_AMBULATORY_CARE_PROVIDER_SITE_OTHER): Payer: BC Managed Care – PPO | Admitting: Physician Assistant

## 2012-02-06 VITALS — BP 112/74 | HR 82 | Temp 98.3°F | Resp 16 | Ht 66.0 in | Wt 226.4 lb

## 2012-02-06 DIAGNOSIS — F988 Other specified behavioral and emotional disorders with onset usually occurring in childhood and adolescence: Secondary | ICD-10-CM

## 2012-02-06 DIAGNOSIS — F419 Anxiety disorder, unspecified: Secondary | ICD-10-CM

## 2012-02-06 DIAGNOSIS — F341 Dysthymic disorder: Secondary | ICD-10-CM

## 2012-02-06 DIAGNOSIS — F411 Generalized anxiety disorder: Secondary | ICD-10-CM

## 2012-02-06 DIAGNOSIS — Z3041 Encounter for surveillance of contraceptive pills: Secondary | ICD-10-CM

## 2012-02-06 DIAGNOSIS — F5105 Insomnia due to other mental disorder: Secondary | ICD-10-CM

## 2012-02-06 DIAGNOSIS — J329 Chronic sinusitis, unspecified: Secondary | ICD-10-CM

## 2012-02-06 DIAGNOSIS — F418 Other specified anxiety disorders: Secondary | ICD-10-CM

## 2012-02-06 MED ORDER — LORAZEPAM 1 MG PO TABS: 0.5000 mg | ORAL_TABLET | Freq: Two times a day (BID) | ORAL | Status: DC | PRN

## 2012-02-06 MED ORDER — VENLAFAXINE HCL ER 37.5 MG PO CP24: ORAL_CAPSULE | ORAL | Status: DC

## 2012-02-06 MED ORDER — NORGESTIMATE-ETH ESTRADIOL 0.25-35 MG-MCG PO TABS: 1.0000 | ORAL_TABLET | Freq: Every day | ORAL | Status: DC

## 2012-02-06 MED ORDER — AMPHETAMINE-DEXTROAMPHETAMINE 30 MG PO TABS: 30.0000 mg | ORAL_TABLET | Freq: Two times a day (BID) | ORAL | Status: DC

## 2012-02-06 MED ORDER — AMOXICILLIN 875 MG PO TABS: 1725.0000 mg | ORAL_TABLET | Freq: Two times a day (BID) | ORAL | Status: AC

## 2012-02-06 MED ORDER — GUAIFENESIN ER 1200 MG PO TB12: 1.0000 | ORAL_TABLET | Freq: Two times a day (BID) | ORAL | Status: DC

## 2012-02-06 NOTE — Patient Instructions (Addendum)
[facility] Policy for Prescribing Controlled Substances (Revised 12/2011) 1. Prescriptions for controlled substances will be filled by ONE provider at [facility] with whom you have established and developed a plan for your care, including follow-up. 2. You are encouraged to schedule an appointment with your prescriber at our appointment center for follow-up visits whenever possible. 3. If you request a prescription for the controlled substance while at [facility] for an acute problem (with someone other than your regular prescriber), you MAY be given a ONE-TIME prescription for a 30-day supply of the controlled substance, to allow time for you to return to see your regular prescriber for additional prescriptions.  Depression/anxiety For your celexa taper - take 1/2 pill every day for the next 8 days, then take 1/2 pill every other day for 8 days - then stop it For your Effexor - take 1 pill daily for 8 days then increase to 2 pills  Will recheck in 4-6 weeks for a complete physical - come fasting

## 2012-02-06 NOTE — Progress Notes (Signed)
[address] [place] [address]   Phone [phone]  Subjective:    Patient ID: Michele Barker, female    DOB: 01-18-1986, 26 y.o.   MRN: [id number]  HPI  Pt presents to clinic for several reasons 1- has had sold symptoms for over a week.  She used OTC cold preps for a week and then 2 days ago her symptoms worsened - with headaches and dizziness and increase pressure in her sinuses. Her rhinorrhea is dark yellow.  She has no cough or other chest symptoms.  She has had no f/c, no N/V/D.   2- she is worried that her depression and anxiety are getting worse.  She is now seeing a Veterinary surgeon at [employer] and feels that is helping a lot.  She has felt for a while that her Celexa is no longer helping her symptoms.  Her mood is depressed again, her stress and anxiety are worse.  She is not sleeping at night without her Ativan.  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  She has recently moved to a new apartment by herself and she is no longer taking care of her mother which is a great relief for her but her anxiety is still high.  Her goal is to not need her Ativan daily and truly use it prn. 3- She needs a refill of her Adderall.  She has decreased her dose to bid (8am and noon) because the 3rd dose was increasing her anxiety at night.  Since she has dropped her last Adderall dose she has decreased the use of her Ativan.    Review of Systems  Constitutional: Negative for chills and fatigue.  HENT: Positive for congestion, rhinorrhea, postnasal drip and sinus pressure. Negative for ear pain and sore throat.   Gastrointestinal: Negative for nausea, vomiting and diarrhea.  Neurological: Positive for dizziness and headaches.  Psychiatric/Behavioral: Positive for sleep disturbance, dysphoric mood and decreased concentration. Negative for suicidal ideas. The patient is nervous/anxious.        Objective:   Physical Exam  Vitals  reviewed. Constitutional: She is oriented to person, place, and time. She appears well-developed and well-nourished.  HENT:  Head: Normocephalic and atraumatic.  Right Ear: Hearing, tympanic membrane, external ear and ear canal normal.  Left Ear: Hearing, tympanic membrane, external ear and ear canal normal.  Nose: Mucosal edema (red) present.  Mouth/Throat: Uvula is midline, oropharynx is clear and moist and mucous membranes are normal.  Eyes: Conjunctivae normal are normal.  Neck: Neck supple.  Cardiovascular: Normal rate, regular rhythm and normal heart sounds.   Pulmonary/Chest: Effort normal and breath sounds normal.  Lymphadenopathy:    She has no cervical adenopathy.  Neurological: She is alert and oriented to person, place, and time.  Skin: Skin is warm and dry.  Psychiatric: She has a normal mood and affect. Her behavior is normal. Judgment and thought content normal.          Assessment & Plan:   1. Sinusitis  amoxicillin (AMOXIL) 875 MG tablet, Guaifenesin (MUCINEX MAXIMUM STRENGTH) 1200 MG TB12  2. Anxiety    3. ADD (attention deficit disorder)  amphetamine-dextroamphetamine (ADDERALL) 30 MG tablet  4. Family planning, BCP (birth control pills) maintenance  norgestimate-ethinyl estradiol (MONONESSA) 0.25-35 MG-MCG tablet  5. 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 Our goal will be to get to true  prn use of Ativan and we may add trazodone to bedtime for insomnia.  Pt understands and agrees with the above.

## 2012-02-13 ENCOUNTER — Telehealth: Payer: Self-pay

## 2012-02-13 NOTE — Telephone Encounter (Signed)
PATIENT WAS SEEN LAST WEEK BY [facility] FOR SINUS INFECTION. SHE WAS PUT ON ANTIBIOTIC AND WANTS SOMETHING STRONGER BECAUSE SHE IS NOT BETTER. BEST NUMBER TO REACH HER IS [phone]

## 2012-02-13 NOTE — Telephone Encounter (Signed)
Called patient to discuss states was better but now some of the congestion has  come back. She is advised to go back on the mucinex, she stopped taking it. She is coughing now, states it is from drainage, bothers her at night. I told her I would ask if we could get her cough meds to take at night. Please advise.

## 2012-02-15 MED ORDER — HYDROCODONE-HOMATROPINE 5-1.5 MG/5ML PO SYRP: 5.0000 mL | ORAL_SOLUTION | Freq: Three times a day (TID) | ORAL | Status: DC | PRN

## 2012-02-15 NOTE — Telephone Encounter (Signed)
Hycodan rx printed and ready to be faxed

## 2012-02-15 NOTE — Telephone Encounter (Signed)
Pt advised this was sent for her

## 2012-03-05 ENCOUNTER — Telehealth: Payer: Self-pay

## 2012-03-05 NOTE — Telephone Encounter (Signed)
Pt states that she recently started a new mediacation venlafaxine XR (EFFEXOR XR) 37.5 MG 24 hr capsule pt states that this medication was working for her initially but now it does not seem to be working. Pt complains of increased anxiety, OCD, and beginning to become easily agitated. Pt would like to possibly increase the dosage on this medication. Best# [phone]

## 2012-03-06 ENCOUNTER — Other Ambulatory Visit: Payer: Self-pay | Admitting: Physician Assistant

## 2012-03-06 NOTE — Telephone Encounter (Signed)
Should she RTC for dosage change

## 2012-03-06 NOTE — Telephone Encounter (Signed)
Advise patient to come in and discuss this. If becomes acutely agitated go to the ER/911.

## 2012-03-07 NOTE — Telephone Encounter (Signed)
Patient has appt with Benny Lennert on Feb 5th. Please advise. Patient c/o increased agitation and anxiety on the Effexor 37.5 asking for dose increase or medication change.  There is a separate phone message regarding this. Margarie Mcguirt

## 2012-03-07 NOTE — Telephone Encounter (Signed)
LMOM to RTC to discuss this.

## 2012-03-10 NOTE — Telephone Encounter (Signed)
Please have the patient if she can tolerate it increase her Effexor to 75mg . if she is already taking that see if she can take 3 pills a day of the Effexor.  I think the dose is not enough to help with her normal anxiety.  She can continue her Ativan but she can take an additional dose each day.  ( I have the Rx at 104 and will bring it up after my appts)

## 2012-03-15 NOTE — Telephone Encounter (Signed)
I called patient to advise, apologized for the delay.

## 2012-03-24 ENCOUNTER — Ambulatory Visit (INDEPENDENT_AMBULATORY_CARE_PROVIDER_SITE_OTHER): Payer: BC Managed Care – PPO | Admitting: Physician Assistant

## 2012-03-24 ENCOUNTER — Encounter: Payer: Self-pay | Admitting: Physician Assistant

## 2012-03-24 VITALS — BP 126/66 | HR 114 | Temp 98.3°F | Resp 16 | Ht 66.0 in | Wt 226.4 lb

## 2012-03-24 DIAGNOSIS — G4700 Insomnia, unspecified: Secondary | ICD-10-CM

## 2012-03-24 DIAGNOSIS — J45909 Unspecified asthma, uncomplicated: Secondary | ICD-10-CM

## 2012-03-24 DIAGNOSIS — IMO0002 Reserved for concepts with insufficient information to code with codable children: Secondary | ICD-10-CM

## 2012-03-24 DIAGNOSIS — F341 Dysthymic disorder: Secondary | ICD-10-CM

## 2012-03-24 DIAGNOSIS — Z0000 Encounter for general adult medical examination without abnormal findings: Secondary | ICD-10-CM

## 2012-03-24 DIAGNOSIS — Z3041 Encounter for surveillance of contraceptive pills: Secondary | ICD-10-CM

## 2012-03-24 DIAGNOSIS — F418 Other specified anxiety disorders: Secondary | ICD-10-CM

## 2012-03-24 DIAGNOSIS — Z23 Encounter for immunization: Secondary | ICD-10-CM

## 2012-03-24 DIAGNOSIS — R87619 Unspecified abnormal cytological findings in specimens from cervix uteri: Secondary | ICD-10-CM

## 2012-03-24 DIAGNOSIS — F988 Other specified behavioral and emotional disorders with onset usually occurring in childhood and adolescence: Secondary | ICD-10-CM

## 2012-03-24 LAB — POCT URINALYSIS DIPSTICK
Glucose, UA: NEGATIVE
Ketones, UA: NEGATIVE
Leukocytes, UA: NEGATIVE
Spec Grav, UA: 1.025
Urobilinogen, UA: 0.2

## 2012-03-24 MED ORDER — MOMETASONE FURO-FORMOTEROL FUM 100-5 MCG/ACT IN AERO: 2.0000 | INHALATION_SPRAY | Freq: Two times a day (BID) | RESPIRATORY_TRACT | Status: DC

## 2012-03-24 MED ORDER — TRAZODONE HCL 50 MG PO TABS: 50.0000 mg | ORAL_TABLET | Freq: Every evening | ORAL | Status: DC | PRN

## 2012-03-24 MED ORDER — AMPHETAMINE-DEXTROAMPHET ER 30 MG PO CP24: 30.0000 mg | ORAL_CAPSULE | ORAL | Status: DC

## 2012-03-24 MED ORDER — VENLAFAXINE HCL ER 150 MG PO CP24: 150.0000 mg | ORAL_CAPSULE | Freq: Every day | ORAL | Status: DC

## 2012-03-24 MED ORDER — NORGESTIMATE-ETH ESTRADIOL 0.25-35 MG-MCG PO TABS: 1.0000 | ORAL_TABLET | Freq: Every day | ORAL | Status: DC

## 2012-03-24 NOTE — Progress Notes (Signed)
[address] [place] [address]   Phone [phone]  Subjective:    Patient ID: Michele Barker, female    DOB: Sep 13, 1985, 27 y.o.   MRN: [id number]  HPI Pt presents to clinic for CPE.  She is doing well. She needs a PAP smear because she has not had one in years. 1- anxiety is so much better on Effexor - in fact she did not know how bad it was until she has gotten to where she is now.  She does still have problems with depressed mood several times a week.  Sometimes for a couple of hours and sometimes for the whole day.  A lot of times it will happen when she has gotten upset about something and sometimes it happens when she has nothing to do but sit and think.  She would really like to get off her Ativan but currently is just using it to sleep because when she lays down at night she cannot turn off her brain.  She starts to worry and then cannot sleep at all.   2- ADD seems to be a little worse with her decrease in dose.  She was taking her medication tid but could not take it after 2pm or she would be up all night.  We decreased her dose to bid and she takes in the am when she wakes up and then again at 11 but notices that her focus is diminished by the afternoon but is afraid of the 3rd dose due to her insomnia issues in the past.  She really likes her current job but sometimes has a lot of assignments that she wants to get completed but her focus is not there.  When the medicine is in her system she does feel like it is enough just does not last long enough. Pt is trying exercise by walking her dog but does not do it as much as she needs to.  Does think that she sleeps better on these days of exercise.  Review of Systems  Constitutional: Negative.   HENT:       Still with allergy symptoms but overall with medication they are improved.  Eyes: Negative.   Respiratory: Negative.   Cardiovascular: Negative.   Gastrointestinal: Negative.   Genitourinary: Negative.   Musculoskeletal:  Negative.   Skin: Negative.   Neurological: Negative.   Hematological: Negative.   Psychiatric/Behavioral: Positive for sleep disturbance (trouble falling asleep), dysphoric mood (intermittent) and decreased concentration. Negative for self-injury. The patient is nervous/anxious (much better on new medication).        Objective:   Physical Exam  Vitals reviewed. Constitutional: She is oriented to person, place, and time. She appears well-developed and well-nourished.  HENT:  Head: Normocephalic and atraumatic.  Right Ear: Hearing, tympanic membrane, external ear and ear canal normal.  Left Ear: Hearing, tympanic membrane, external ear and ear canal normal.  Nose: Nose normal.  Mouth/Throat: Uvula is midline, oropharynx is clear and moist and mucous membranes are normal.  Eyes: Conjunctivae normal and EOM are normal. Pupils are equal, round, and reactive to light.  Neck: Normal range of motion. Neck supple. No thyromegaly present.  Cardiovascular: Normal rate, regular rhythm, normal heart sounds and intact distal pulses.   No murmur heard. Pulmonary/Chest: Effort normal and breath sounds normal. She has no wheezes.  Abdominal: Soft. Bowel sounds are normal. She exhibits no distension. There is no tenderness.  Genitourinary: Vagina normal and uterus normal.  Musculoskeletal: Normal range of motion.  Neurological: She is alert and oriented to person, place, and time. She has normal reflexes.  Skin: Skin is warm and dry.  Psychiatric: She has a normal mood and affect. Her behavior is normal. Judgment and thought content normal.          Assessment & Plan:   1. Annual physical exam  CBC, Lipid panel, Comprehensive metabolic panel, TSH, POCT urinalysis dipstick, Pap IG w/ reflex to HPV when ASC-U  2. Flu vaccine need  Flu vaccine greater than or equal to 3yo preservative free IM  3. Need for diphtheria-tetanus-pertussis (Tdap) vaccine, adult/adolescent  Tdap vaccine greater than or  equal to 7yo IM  4. Depression with anxiety  venlafaxine XR (EFFEXOR XR) 150 MG 24 hr capsule  5. Insomnia  traZODone (DESYREL) 50 MG tablet  6. ADD (attention deficit disorder)  amphetamine-dextroamphetamine (ADDERALL XR) 30 MG 24 hr capsule  7. Family planning, BCP (birth control pills) maintenance  norgestimate-ethinyl estradiol (MONONESSA) 0.25-35 MG-MCG tablet  8. Asthma  mometasone-formoterol (DULERA) 100-5 MCG/ACT AERO   1- Updated health maintenance.   2- gave flu vaccine 3- updated tetanus vaccine 4- anxiety is significantly improved but because her depression is no better will increase her dose of Effexor.   5- Add trazodone and hopefully that will help with her insomnia ans well as her remaining depression symptoms. 6- change her dose of Adderall to extended release in order to give her better coverage for focus during the work day but not increase her insominia. She will continue to only take it on there weekdays. 7- Continue OCP 8- meds reordered.  Pt to call in 1 month with her response the increase in Effexor and addition of Trazodone and change in Adderall.  Pt understands and agrees with the above plan.  If everything is doing well will recheck in 6 months.

## 2012-03-25 LAB — PAP IG W/ RFLX HPV ASCU

## 2012-03-25 LAB — LIPID PANEL
Cholesterol: 207 mg/dL — ABNORMAL HIGH (ref 0–200)
HDL: 71 mg/dL (ref >39)
Total CHOL/HDL Ratio: 2.9 Ratio
Triglycerides: 191 mg/dL — ABNORMAL HIGH (ref <150)
VLDL: 38 mg/dL (ref 0–40)

## 2012-03-25 LAB — COMPREHENSIVE METABOLIC PANEL
AST: 14 U/L (ref 0–37)
Alkaline Phosphatase: 62 U/L (ref 39–117)
BUN: 11 mg/dL (ref 6–23)
Creat: 0.71 mg/dL (ref 0.50–1.10)
Glucose, Bld: 82 mg/dL (ref 70–99)

## 2012-03-25 LAB — CBC
Hemoglobin: 14.3 g/dL (ref 12.0–15.0)
RBC: 5.2 MIL/uL — ABNORMAL HIGH (ref 3.87–5.11)
WBC: 10.9 10*3/uL — ABNORMAL HIGH (ref 4.0–10.5)

## 2012-03-26 NOTE — Addendum Note (Signed)
Addended by: Morrell Riddle on: 03/26/2012 12:17 PM   Modules accepted: Orders

## 2012-04-16 ENCOUNTER — Ambulatory Visit (INDEPENDENT_AMBULATORY_CARE_PROVIDER_SITE_OTHER): Payer: BC Managed Care – PPO | Admitting: Physician Assistant

## 2012-04-16 VITALS — BP 110/70 | HR 113 | Temp 98.2°F | Resp 16 | Ht 66.0 in | Wt 235.2 lb

## 2012-04-16 DIAGNOSIS — J069 Acute upper respiratory infection, unspecified: Secondary | ICD-10-CM

## 2012-04-16 MED ORDER — TRIAMCINOLONE ACETONIDE(NASAL) 55 MCG/ACT NA INHA: 2.0000 | Freq: Every day | NASAL | Status: DC

## 2012-04-16 MED ORDER — HYDROCOD POLST-CHLORPHEN POLST 10-8 MG/5ML PO LQCR: 5.0000 mL | Freq: Two times a day (BID) | ORAL | Status: DC

## 2012-04-16 MED ORDER — IPRATROPIUM BROMIDE 0.06 % NA SOLN: 2.0000 | Freq: Three times a day (TID) | NASAL | Status: DC

## 2012-04-16 MED ORDER — GUAIFENESIN ER 1200 MG PO TB12: 1.0000 | ORAL_TABLET | Freq: Two times a day (BID) | ORAL | Status: DC

## 2012-04-16 MED ORDER — AMOXICILLIN 875 MG PO TABS: 1750.0000 mg | ORAL_TABLET | Freq: Two times a day (BID) | ORAL | Status: DC

## 2012-04-16 NOTE — Patient Instructions (Addendum)
Use nasal saline spray for congestion and bloody mucus.  Push fluids.  Tylenol/motrin as needed.  Hold onto the antibiotic for 2-3 days and see if the medications work and then if you are not getting relief start the antibiotic.

## 2012-04-16 NOTE — Progress Notes (Signed)
   [address] [place] [address]   Phone [phone]  Subjective:    Patient ID: Pricilla Riffle, female    DOB: June 16, 1985, 27 y.o.   MRN: [id number]  HPI Pt presents to clinic with >1 wk h/o cold symptoms.  She started with a cold and was sporadically using OTC cold preps and thinking she was getting a little better but then yesterday started to feel terrible with increase sinus pressure and yellow/green rhinorrhea with PND and cough with similar colored sputum.  She has been using her albuterol slightly more.  She typically gets spring allergies but feels like this is a little different and early.  She has been around people who have had similar symptoms.  She is not currently using her allergy medication.   Review of Systems  Constitutional: Negative for fever and chills.  HENT: Positive for ear pain (bilaterally), congestion, sore throat (irritated), rhinorrhea and postnasal drip.   Respiratory: Positive for cough, shortness of breath (releived with inhaler) and wheezing.   Neurological: Positive for headaches.       Objective:   Physical Exam  Vitals reviewed. Constitutional: She is oriented to person, place, and time. She appears well-developed and well-nourished.  HENT:  Head: Normocephalic and atraumatic.  Right Ear: Hearing, tympanic membrane, external ear and ear canal normal.  Left Ear: Hearing, tympanic membrane, external ear and ear canal normal.  Nose: Mucosal edema (singificant swelling/ erythema) present.  Mouth/Throat: Uvula is midline, oropharynx is clear and moist and mucous membranes are normal.  Eyes: Conjunctivae are normal.  Neck: Neck supple.  Cardiovascular: Normal rate, regular rhythm and normal heart sounds.   No murmur heard. Pulmonary/Chest: Effort normal and breath sounds normal. She has no wheezes.  Lymphadenopathy:    She has no cervical adenopathy.  Neurological: She is alert and oriented to person, place, and time.  Skin: Skin is warm and  dry.  Psychiatric: She has a normal mood and affect. Her behavior is normal. Judgment and thought content normal.       Assessment & Plan:  URI, acute - I think that the patient is still experiencing URI and just has not been adequately treating her symptoms.  We will try more aggressive symptomatic care for the next 3 days and if no improvement pt has a Rx for Amoxil that she can start.  She will monitor her asthma and breathing and if that gets worse she will RTC.  We discussed starting to treat her allergies now to prevent problems in the spring.  Plan: triamcinolone (NASACORT AQ) 55 MCG/ACT nasal inhaler, ipratropium (ATROVENT) 0.06 % nasal spray, chlorpheniramine-HYDROcodone (TUSSIONEX PENNKINETIC ER) 10-8 MG/5ML LQCR, Guaifenesin (MUCINEX MAXIMUM STRENGTH) 1200 MG TB12, amoxicillin (AMOXIL) 875 MG tablet

## 2012-05-03 ENCOUNTER — Telehealth: Payer: Self-pay

## 2012-05-03 DIAGNOSIS — F988 Other specified behavioral and emotional disorders with onset usually occurring in childhood and adolescence: Secondary | ICD-10-CM

## 2012-05-03 NOTE — Telephone Encounter (Signed)
Michele Barker    Patient states the lower dose RX is not working as well as previous [employer].   Needs a refill on adderall.  [phone]

## 2012-05-04 NOTE — Telephone Encounter (Signed)
I am sorry but I am unsure what med she is talking about.  I do not think that I decreased any of her meds.  We did change her from immediate acting to XR Adderall. She is that is what she is talking about please.

## 2012-05-05 ENCOUNTER — Other Ambulatory Visit: Payer: Self-pay | Admitting: Gynecology

## 2012-05-05 NOTE — Telephone Encounter (Signed)
Left message for patient to call back and advise what she is in need of .

## 2012-05-06 NOTE — Telephone Encounter (Signed)
Called and could not leave message, got recording she is not available.

## 2012-05-07 NOTE — Telephone Encounter (Signed)
Called her to advise, but unable to leave message.

## 2012-05-07 NOTE — Telephone Encounter (Signed)
Pt is on a really high dose.  I think an OV would be best to discuss what the problem is

## 2012-05-07 NOTE — Telephone Encounter (Signed)
She is asking for a higher dose of the Adderall XR. Please advise.

## 2012-05-09 NOTE — Telephone Encounter (Signed)
Called pt and advised she would have to come in for dose change. She needs a refill for her current dose now. Can we refill Adderall? Please advise.

## 2012-05-10 NOTE — Telephone Encounter (Signed)
She has advised the XR worked better than the regular. Please advise. pended

## 2012-05-10 NOTE — Telephone Encounter (Signed)
Does she think that the immediate release or the extended release worked better for her and I will write that Rx.

## 2012-05-11 MED ORDER — AMPHETAMINE-DEXTROAMPHET ER 30 MG PO CP24: 30.0000 mg | ORAL_CAPSULE | ORAL | Status: DC

## 2012-05-11 NOTE — Telephone Encounter (Signed)
Rx up front, called pt to let her know but mailbox is full.

## 2012-05-11 NOTE — Telephone Encounter (Signed)
At [facility] for pick-up

## 2012-05-14 ENCOUNTER — Encounter (HOSPITAL_BASED_OUTPATIENT_CLINIC_OR_DEPARTMENT_OTHER): Payer: Self-pay

## 2012-05-14 ENCOUNTER — Emergency Department (HOSPITAL_BASED_OUTPATIENT_CLINIC_OR_DEPARTMENT_OTHER)
Admission: EM | Admit: 2012-05-14 | Discharge: 2012-05-14 | Disposition: A | Discharge status: Home / Self Care | Payer: No Typology Code available for payment source | Attending: Emergency Medicine | Admitting: Emergency Medicine

## 2012-05-14 ENCOUNTER — Emergency Department (HOSPITAL_BASED_OUTPATIENT_CLINIC_OR_DEPARTMENT_OTHER): Payer: No Typology Code available for payment source

## 2012-05-14 DIAGNOSIS — Z79899 Other long term (current) drug therapy: Secondary | ICD-10-CM | POA: Insufficient documentation

## 2012-05-14 DIAGNOSIS — K219 Gastro-esophageal reflux disease without esophagitis: Secondary | ICD-10-CM | POA: Insufficient documentation

## 2012-05-14 DIAGNOSIS — F17200 Nicotine dependence, unspecified, uncomplicated: Secondary | ICD-10-CM | POA: Insufficient documentation

## 2012-05-14 DIAGNOSIS — Y939 Activity, unspecified: Secondary | ICD-10-CM | POA: Insufficient documentation

## 2012-05-14 DIAGNOSIS — S139XXA Sprain of joints and ligaments of unspecified parts of neck, initial encounter: Secondary | ICD-10-CM | POA: Insufficient documentation

## 2012-05-14 DIAGNOSIS — J45909 Unspecified asthma, uncomplicated: Secondary | ICD-10-CM | POA: Insufficient documentation

## 2012-05-14 DIAGNOSIS — F3289 Other specified depressive episodes: Secondary | ICD-10-CM | POA: Insufficient documentation

## 2012-05-14 DIAGNOSIS — Y92410 Unspecified street and highway as the place of occurrence of the external cause: Secondary | ICD-10-CM | POA: Insufficient documentation

## 2012-05-14 DIAGNOSIS — F329 Major depressive disorder, single episode, unspecified: Secondary | ICD-10-CM | POA: Insufficient documentation

## 2012-05-14 DIAGNOSIS — S161XXA Strain of muscle, fascia and tendon at neck level, initial encounter: Secondary | ICD-10-CM

## 2012-05-14 DIAGNOSIS — F411 Generalized anxiety disorder: Secondary | ICD-10-CM | POA: Insufficient documentation

## 2012-05-14 DIAGNOSIS — IMO0002 Reserved for concepts with insufficient information to code with codable children: Secondary | ICD-10-CM | POA: Insufficient documentation

## 2012-05-14 MED ORDER — CYCLOBENZAPRINE HCL 5 MG PO TABS: 5.0000 mg | ORAL_TABLET | Freq: Three times a day (TID) | ORAL | Status: DC | PRN

## 2012-05-14 MED ORDER — HYDROCODONE-ACETAMINOPHEN 5-325 MG PO TABS: 1.0000 | ORAL_TABLET | Freq: Four times a day (QID) | ORAL | Status: DC | PRN

## 2012-05-14 MED ORDER — NAPROXEN 500 MG PO TABS: 500.0000 mg | ORAL_TABLET | Freq: Two times a day (BID) | ORAL | Status: DC
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
## 2012-05-17 ENCOUNTER — Ambulatory Visit: Payer: BC Managed Care – PPO

## 2012-05-17 ENCOUNTER — Ambulatory Visit (INDEPENDENT_AMBULATORY_CARE_PROVIDER_SITE_OTHER): Payer: BC Managed Care – PPO | Admitting: Family Medicine

## 2012-05-17 DIAGNOSIS — M542 Cervicalgia: Secondary | ICD-10-CM

## 2012-05-17 DIAGNOSIS — M25559 Pain in unspecified hip: Secondary | ICD-10-CM

## 2012-05-17 DIAGNOSIS — M549 Dorsalgia, unspecified: Secondary | ICD-10-CM

## 2012-05-17 DIAGNOSIS — M25552 Pain in left hip: Secondary | ICD-10-CM

## 2012-05-17 MED ORDER — CYCLOBENZAPRINE HCL 10 MG PO TABS: 10.0000 mg | ORAL_TABLET | Freq: Three times a day (TID) | ORAL | Status: DC | PRN

## 2012-05-17 MED ORDER — HYDROCODONE-ACETAMINOPHEN 5-325 MG PO TABS: 1.0000 | ORAL_TABLET | Freq: Four times a day (QID) | ORAL | Status: DC | PRN
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
## 2012-05-17 NOTE — Patient Instructions (Addendum)
Use the muscle relaxer and pain medicine as needed- be cautious as they can both cause sedation.  Do not drive while on these medications, and be cautious of using them together.  Let us know if you are not feeling better in the next few days- Sooner if worse.

## 2012-05-18 ENCOUNTER — Other Ambulatory Visit: Payer: Self-pay | Admitting: Physician Assistant

## 2012-05-22 ENCOUNTER — Other Ambulatory Visit: Payer: Self-pay | Admitting: Physician Assistant

## 2012-06-01 ENCOUNTER — Ambulatory Visit (INDEPENDENT_AMBULATORY_CARE_PROVIDER_SITE_OTHER): Payer: BC Managed Care – PPO | Admitting: Physician Assistant

## 2012-06-01 VITALS — BP 122/82 | HR 134 | Temp 98.4°F | Resp 17 | Ht 66.0 in | Wt 240.0 lb

## 2012-06-01 DIAGNOSIS — F988 Other specified behavioral and emotional disorders with onset usually occurring in childhood and adolescence: Secondary | ICD-10-CM

## 2012-06-01 DIAGNOSIS — G4700 Insomnia, unspecified: Secondary | ICD-10-CM

## 2012-06-01 DIAGNOSIS — M79605 Pain in left leg: Secondary | ICD-10-CM

## 2012-06-01 DIAGNOSIS — M545 Low back pain: Secondary | ICD-10-CM

## 2012-06-01 MED ORDER — LORAZEPAM 1 MG PO TABS: 1.0000 mg | ORAL_TABLET | Freq: Two times a day (BID) | ORAL | Status: DC | PRN

## 2012-06-01 MED ORDER — AMPHETAMINE-DEXTROAMPHETAMINE 30 MG PO TABS: 30.0000 mg | ORAL_TABLET | Freq: Two times a day (BID) | ORAL | Status: DC

## 2012-06-01 MED ORDER — CYCLOBENZAPRINE HCL 10 MG PO TABS: 10.0000 mg | ORAL_TABLET | Freq: Three times a day (TID) | ORAL | Status: DC | PRN

## 2012-06-01 MED ORDER — PREDNISONE 10 MG PO TABS: 10.0000 mg | ORAL_TABLET | Freq: Every day | ORAL | Status: DC

## 2012-06-01 MED ORDER — TRAZODONE HCL 100 MG PO TABS: 50.0000 mg | ORAL_TABLET | Freq: Every evening | ORAL | Status: DC | PRN
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
## 2012-06-02 ENCOUNTER — Telehealth: Payer: Self-pay

## 2012-06-02 DIAGNOSIS — G4700 Insomnia, unspecified: Secondary | ICD-10-CM

## 2012-06-02 NOTE — Telephone Encounter (Signed)
Pharmacy is requesting #[phone] for a 90 day supply of Trazodone 100 mg. Maralyn Sago, do you want to Rx that #?

## 2012-06-03 ENCOUNTER — Telehealth: Payer: Self-pay

## 2012-06-03 ENCOUNTER — Encounter: Payer: Self-pay | Admitting: Radiology

## 2012-06-03 MED ORDER — TRAZODONE HCL 100 MG PO TABS: 50.0000 mg | ORAL_TABLET | Freq: Every evening | ORAL | Status: DC | PRN

## 2012-06-03 NOTE — Telephone Encounter (Signed)
I have done for the patient. 

## 2012-06-03 NOTE — Telephone Encounter (Signed)
Request given to xray 

## 2012-06-03 NOTE — Telephone Encounter (Addendum)
PT WOULD LIKE TO COME BY AND P/U A COPY OF A CD XRAY SHE HAD DONE. HAVE AN APPT ON TUESDAY PLEASE CALL [phone]

## 2012-06-07 ENCOUNTER — Encounter (HOSPITAL_BASED_OUTPATIENT_CLINIC_OR_DEPARTMENT_OTHER): Payer: Self-pay | Admitting: *Deleted

## 2012-06-07 DIAGNOSIS — Z79899 Other long term (current) drug therapy: Secondary | ICD-10-CM | POA: Insufficient documentation

## 2012-06-07 DIAGNOSIS — R319 Hematuria, unspecified: Secondary | ICD-10-CM | POA: Insufficient documentation

## 2012-06-07 DIAGNOSIS — R109 Unspecified abdominal pain: Secondary | ICD-10-CM | POA: Insufficient documentation

## 2012-06-07 DIAGNOSIS — Z3202 Encounter for pregnancy test, result negative: Secondary | ICD-10-CM | POA: Insufficient documentation

## 2012-06-07 DIAGNOSIS — R350 Frequency of micturition: Secondary | ICD-10-CM | POA: Insufficient documentation

## 2012-06-07 DIAGNOSIS — F341 Dysthymic disorder: Secondary | ICD-10-CM | POA: Insufficient documentation

## 2012-06-07 DIAGNOSIS — F17200 Nicotine dependence, unspecified, uncomplicated: Secondary | ICD-10-CM | POA: Insufficient documentation

## 2012-06-07 DIAGNOSIS — J45909 Unspecified asthma, uncomplicated: Secondary | ICD-10-CM | POA: Insufficient documentation

## 2012-06-07 DIAGNOSIS — N23 Unspecified renal colic: Secondary | ICD-10-CM | POA: Insufficient documentation

## 2012-06-07 DIAGNOSIS — N390 Urinary tract infection, site not specified: Secondary | ICD-10-CM | POA: Insufficient documentation

## 2012-06-07 LAB — URINE MICROSCOPIC-ADD ON

## 2012-06-07 LAB — URINALYSIS, ROUTINE W REFLEX MICROSCOPIC
Bilirubin Urine: NEGATIVE
Protein, ur: >300 mg/dL — AB
Urobilinogen, UA: 1 mg/dL (ref 0.0–1.0)

## 2012-06-07 NOTE — ED Notes (Signed)
Pt c/o painful freq urination with left flank pain x 1 day

## 2012-06-08 ENCOUNTER — Emergency Department (HOSPITAL_BASED_OUTPATIENT_CLINIC_OR_DEPARTMENT_OTHER): Payer: BC Managed Care – PPO

## 2012-06-08 ENCOUNTER — Emergency Department (HOSPITAL_BASED_OUTPATIENT_CLINIC_OR_DEPARTMENT_OTHER)
Admission: EM | Admit: 2012-06-08 | Discharge: 2012-06-08 | Disposition: A | Discharge status: Home / Self Care | Payer: BC Managed Care – PPO | Attending: Emergency Medicine | Admitting: Emergency Medicine

## 2012-06-08 DIAGNOSIS — N390 Urinary tract infection, site not specified: Secondary | ICD-10-CM

## 2012-06-08 DIAGNOSIS — N23 Unspecified renal colic: Secondary | ICD-10-CM

## 2012-06-08 LAB — CBC WITH DIFFERENTIAL/PLATELET
Basophils Relative: 0 % (ref 0–1)
Eosinophils Relative: 3 % (ref 0–5)
Lymphs Abs: 4.8 10*3/uL — ABNORMAL HIGH (ref 0.7–4.0)
MCH: 27 pg (ref 26.0–34.0)
MCV: 84.6 fL (ref 78.0–100.0)
Monocytes Absolute: 1.9 10*3/uL — ABNORMAL HIGH (ref 0.1–1.0)
Platelets: 451 10*3/uL — ABNORMAL HIGH (ref 150–400)
RBC: 4.6 MIL/uL (ref 3.87–5.11)

## 2012-06-08 LAB — BASIC METABOLIC PANEL
BUN: 12 mg/dL (ref 6–23)
CO2: 24 mEq/L (ref 19–32)
Calcium: 8.9 mg/dL (ref 8.4–10.5)
Creatinine, Ser: 0.7 mg/dL (ref 0.50–1.10)
Glucose, Bld: 111 mg/dL — ABNORMAL HIGH (ref 70–99)

## 2012-06-08 MED ORDER — CEFTRIAXONE SODIUM 1 G IJ SOLR
1.0000 g | Freq: Once | INTRAMUSCULAR | Status: AC
  Administered 2012-06-08: 1 g via INTRAMUSCULAR
  Filled 2012-06-08: qty 10

## 2012-06-08 MED ORDER — LIDOCAINE HCL (PF) 1 % IJ SOLN
INTRAMUSCULAR | Status: AC
  Administered 2012-06-08: 2.1 mL
  Filled 2012-06-08: qty 5

## 2012-06-08 MED ORDER — CIPROFLOXACIN HCL 500 MG PO TABS: 500.0000 mg | ORAL_TABLET | Freq: Two times a day (BID) | ORAL | Status: DC

## 2012-06-08 MED ORDER — OXYCODONE-ACETAMINOPHEN 5-325 MG PO TABS
2.0000 | ORAL_TABLET | Freq: Once | ORAL | Status: AC
  Administered 2012-06-08: 2 via ORAL
  Filled 2012-06-08 (×2): qty 2

## 2012-06-08 MED ORDER — ONDANSETRON 8 MG PO TBDP: ORAL_TABLET | ORAL | Status: DC

## 2012-06-08 MED ORDER — OXYCODONE-ACETAMINOPHEN 5-325 MG PO TABS: 1.0000 | ORAL_TABLET | Freq: Four times a day (QID) | ORAL | Status: DC | PRN

## 2012-06-08 MED ORDER — ONDANSETRON 8 MG PO TBDP
8.0000 mg | ORAL_TABLET | Freq: Once | ORAL | Status: AC
  Administered 2012-06-08: 8 mg via ORAL
  Filled 2012-06-08: qty 1

## 2012-06-08 NOTE — ED Provider Notes (Signed)
History    This chart was scribed for Michele Huseby Smitty Cords, MD by Michele Barker, ED Scribe. This patient was seen in room MH09/MH09 and the patient's care was started 12:39 AM.   CSN: [id number]  Arrival date & time 06/07/12  2210   First MD Initiated Contact with Patient 06/08/12 0038      Chief Complaint  Patient presents with  . Flank Pain  . Dysuria     Patient is a 27 y.o. female presenting with flank pain and dysuria. The history is provided by the patient. No language interpreter was used.  Flank Pain This is a new problem. The current episode started 3 to 5 hours ago. The problem occurs constantly. The problem has not changed since onset.Pertinent negatives include no abdominal pain. Nothing aggravates the symptoms. Nothing relieves the symptoms. She has tried nothing for the symptoms. The treatment provided no relief.  Dysuria  This is a new problem. The current episode started 12 to 24 hours ago. The problem has not changed since onset.The quality of the pain is described as burning, stabbing and shooting. The pain is moderate. Associated symptoms include frequency, hematuria and flank pain (left sided).    Michele Barker is a 27 y.o. female who presents to the Emergency Department complaining of new, constant, unchanged L flank pain with associated dysuria and frequency starting 1 day ago. Pt describes the pain as stabbing. She has associated hematuria (light pink to dark brown currently), difficulty starting stream. She denies any diarrhea, constipation. Pt has taken naproxen with mild relief but the pain returned. Pt believes she passed a brown/black looking stone about the size of a grain of sugar at about 5 hours ago. LMP 2 weeks ago.    Past Medical History  Diagnosis Date  . GERD (gastroesophageal reflux disease)   . Anxiety and depression   . Asthma   . Allergy   . Depression   . Anxiety     Past Surgical History  Procedure Laterality Date  . Extraction of  wisdom teeth      Family History  Problem Relation Age of Onset  . Depression Mother   . Hypothyroidism Mother   . Heart disease Father   . Coronary artery disease Other   . Gout Maternal Grandfather   . Depression Sister   . Hypothyroidism Sister   . Emphysema Maternal Grandmother   . Kidney disease Paternal Grandfather     History  Substance Use Topics  . Smoking status: Current Every Day Smoker -- 0.50 packs/day    Types: Cigarettes  . Smokeless tobacco: Not on file  . Alcohol Use: No     Comment: rare    OB History   Grav Para Term Preterm Abortions TAB SAB Ect Mult Living                  Review of Systems  Gastrointestinal: Negative for abdominal pain, diarrhea and constipation.  Genitourinary: Positive for dysuria, frequency, hematuria and flank pain (left sided).  All other systems reviewed and are negative.    Allergies  Review of patient's allergies indicates no known allergies.  Home Medications   Current Outpatient Rx  Name  Route  Sig  Dispense  Refill  . albuterol (PROAIR HFA) 108 (90 BASE) MCG/ACT inhaler   Inhalation   Inhale 2 puffs into the lungs every 4 (four) hours as needed for wheezing.   1 Inhaler   6   . amphetamine-dextroamphetamine (ADDERALL) 30 MG tablet  Oral   Take 1 tablet (30 mg total) by mouth 2 (two) times daily.   60 tablet   0   . cyclobenzaprine (FLEXERIL) 10 MG tablet   Oral   Take 1 tablet (10 mg total) by mouth 3 (three) times daily as needed for muscle spasms.   90 tablet   0   . Guaifenesin (MUCINEX MAXIMUM STRENGTH) 1200 MG TB12   Oral   Take 1 tablet (1,200 mg total) by mouth 2 (two) times daily.   28 each   0   . LORazepam (ATIVAN) 1 MG tablet   Oral   Take 1 tablet (1 mg total) by mouth 2 (two) times daily as needed for anxiety.   20 tablet   0   . mometasone-formoterol (DULERA) 100-5 MCG/ACT AERO   Inhalation   Inhale 2 puffs into the lungs 2 (two) times daily.   39 g   4   .  norgestimate-ethinyl estradiol (MONONESSA) 0.25-35 MG-MCG tablet   Oral   Take 1 tablet by mouth daily.   84 tablet   4   . omeprazole (PRILOSEC) 40 MG capsule   Oral   Take 40 mg by mouth daily as needed. gerd         . predniSONE (DELTASONE) 10 MG tablet   Oral   Take 1 tablet (10 mg total) by mouth daily. 6-5-4-3-2-1 taper dose - take the pills for that day in the am all together with food   21 tablet   0   . traZODone (DESYREL) 100 MG tablet   Oral   Take 0.5-1.5 tablets (50-150 mg total) by mouth at bedtime as needed for sleep.   135 tablet   0   . triamcinolone (NASACORT AQ) 55 MCG/ACT nasal inhaler   Nasal   Place 2 sprays into the nose daily.   1 Inhaler   5   . venlafaxine XR (EFFEXOR XR) 150 MG 24 hr capsule   Oral   Take 1 capsule (150 mg total) by mouth daily.   90 capsule   0     BP 140/93  Pulse 120  Temp(Src) 98.7 F (37.1 C) (Oral)  Resp 16  Ht 5\' 6"  (1.676 m)  Wt 240 lb (108.863 kg)  BMI 38.76 kg/m2  SpO2 99%  LMP 05/24/2012  Physical Exam  Nursing note and vitals reviewed. Constitutional: She is oriented to person, place, and time. She appears well-developed and well-nourished.  HENT:  Head: Normocephalic and atraumatic.  Mouth/Throat: Oropharynx is clear and moist.  Eyes: EOM are normal. Pupils are equal, round, and reactive to light.  Neck: Normal range of motion. Neck supple.  Cardiovascular: Normal rate, regular rhythm and normal heart sounds.   Pulmonary/Chest: Effort normal and breath sounds normal. No respiratory distress. She has no wheezes. She has no rales. She exhibits no tenderness.  Abdominal: Soft. Bowel sounds are normal. She exhibits no distension. There is no tenderness. There is no rebound and no guarding.  Musculoskeletal: Normal range of motion.  Neurological: She is alert and oriented to person, place, and time.  Skin: Skin is warm and dry.  Psychiatric: She has a normal mood and affect.    ED Course  Procedures  (including critical care time)  DIAGNOSTIC STUDIES: Oxygen Saturation is 99% on room air, normal by my interpretation.    COORDINATION OF CARE: 12:39 AM-Discussed treatment plan with pt at bedside and pt agreed to plan.    Labs Reviewed  URINALYSIS, ROUTINE W  REFLEX MICROSCOPIC - Abnormal; Notable for the following:    Color, Urine AMBER (*)    APPearance TURBID (*)    Hgb urine dipstick LARGE (*)    Protein, ur >300 (*)    Nitrite POSITIVE (*)    Leukocytes, UA LARGE (*)    All other components within normal limits  URINE MICROSCOPIC-ADD ON - Abnormal; Notable for the following:    Squamous Epithelial / LPF FEW (*)    Bacteria, UA MANY (*)    All other components within normal limits  URINE CULTURE  PREGNANCY, URINE   No results found.   No diagnosis found.    MDM  Manson Passey sand like object was likely a stone.  Suspect infected stone.  Have given rocephin and will start cipro. Follow up with your family doctor in 2 days for recheck of urine and cbc.   Follow up with urology in 1 week.  Call in am to be seen.  Return for fevers, intractable pain, vomiting or any concerns.  Use condoms for 1 month as antibiotics can invalidate oral contraceptives.  Patient and husband verbalize understanding and agree to follow up   I personally performed the services described in this documentation, which was scribed in my presence. The recorded information has been reviewed and is accurate.       Jasmine Awe, MD 06/08/12 [phone]

## 2012-06-09 LAB — URINE CULTURE: Colony Count: >=100000

## 2012-06-10 ENCOUNTER — Telehealth (HOSPITAL_COMMUNITY): Payer: Self-pay | Admitting: Emergency Medicine

## 2012-06-30 ENCOUNTER — Other Ambulatory Visit: Payer: Self-pay

## 2012-06-30 DIAGNOSIS — F418 Other specified anxiety disorders: Secondary | ICD-10-CM

## 2012-06-30 MED ORDER — VENLAFAXINE HCL ER 150 MG PO CP24: 150.0000 mg | ORAL_CAPSULE | Freq: Every day | ORAL | Status: DC

## 2012-06-30 NOTE — Telephone Encounter (Signed)
We have not gotten request from the pharmacy, called her to verify which pharmacy. She uses [facility]

## 2012-06-30 NOTE — Telephone Encounter (Signed)
Pt called again and requests refill. She states she is having withdrawals now. Please adviset

## 2012-06-30 NOTE — Telephone Encounter (Signed)
Pt states that she has spoken with her pharmacy about three days ago  regarding a refill request on effexor and they have stated that we have not responded to the request. Pt is totally out of this medication and would like to have it filled asap. [facility] [phone]

## 2012-07-01 ENCOUNTER — Other Ambulatory Visit: Payer: Self-pay | Admitting: Radiology

## 2012-07-01 NOTE — Telephone Encounter (Signed)
Michele Barker has spoken to patient, her Rx sent in late last pm after I left. [name]

## 2012-07-13 ENCOUNTER — Telehealth: Payer: Self-pay

## 2012-07-13 DIAGNOSIS — F988 Other specified behavioral and emotional disorders with onset usually occurring in childhood and adolescence: Secondary | ICD-10-CM

## 2012-07-13 MED ORDER — AMPHETAMINE-DEXTROAMPHETAMINE 30 MG PO TABS: 30.0000 mg | ORAL_TABLET | Freq: Two times a day (BID) | ORAL | Status: DC

## 2012-07-13 NOTE — Telephone Encounter (Signed)
Pt notified that rx is ready for pickup. Rx is in front in pickup drawer.

## 2012-07-13 NOTE — Telephone Encounter (Signed)
ADDERALL REFILL REQUEST. [id number]

## 2012-07-13 NOTE — Telephone Encounter (Signed)
Meds ordered this encounter  Medications  . amphetamine-dextroamphetamine (ADDERALL) 30 MG tablet    Sig: Take 1 tablet (30 mg total) by mouth 2 (two) times daily.    Dispense:  60 tablet    Refill:  0    Order Specific Question:  Supervising Provider    Answer:  DOOLITTLE, ROBERT P [[id number]]    Ready to be picked up

## 2012-09-01 ENCOUNTER — Telehealth: Payer: Self-pay

## 2012-09-01 DIAGNOSIS — F988 Other specified behavioral and emotional disorders with onset usually occurring in childhood and adolescence: Secondary | ICD-10-CM

## 2012-09-01 NOTE — Telephone Encounter (Signed)
Pt requesting adderall refill    [facility] phone [phone]

## 2012-09-01 NOTE — Telephone Encounter (Signed)
Pended please advise.  

## 2012-09-03 MED ORDER — AMPHETAMINE-DEXTROAMPHETAMINE 30 MG PO TABS: 30.0000 mg | ORAL_TABLET | Freq: Two times a day (BID) | ORAL | Status: DC

## 2012-09-03 NOTE — Telephone Encounter (Signed)
Patient advised.

## 2012-09-03 NOTE — Telephone Encounter (Signed)
Ready for pick up

## 2012-10-13 ENCOUNTER — Telehealth: Payer: Self-pay

## 2012-10-13 DIAGNOSIS — F988 Other specified behavioral and emotional disorders with onset usually occurring in childhood and adolescence: Secondary | ICD-10-CM

## 2012-10-13 NOTE — Telephone Encounter (Signed)
PT IN NEED OF HER ADDERALL. PLEASE CALL [phone] WHEN READY FOR PICK UP

## 2012-10-14 MED ORDER — AMPHETAMINE-DEXTROAMPHETAMINE 30 MG PO TABS: 30.0000 mg | ORAL_TABLET | Freq: Two times a day (BID) | ORAL | Status: DC

## 2012-10-14 NOTE — Telephone Encounter (Signed)
Ready to pick up.  

## 2012-10-15 NOTE — Telephone Encounter (Signed)
Spoke with pt advised  Rx ready to pick up. 

## 2012-11-13 ENCOUNTER — Telehealth: Payer: Self-pay

## 2012-11-13 DIAGNOSIS — F988 Other specified behavioral and emotional disorders with onset usually occurring in childhood and adolescence: Secondary | ICD-10-CM

## 2012-11-13 MED ORDER — AMPHETAMINE-DEXTROAMPHETAMINE 30 MG PO TABS: 30.0000 mg | ORAL_TABLET | Freq: Two times a day (BID) | ORAL | Status: DC

## 2012-11-13 NOTE — Telephone Encounter (Signed)
Rx ready for pick-up.  She will need an ov before more refills.

## 2012-11-13 NOTE — Telephone Encounter (Signed)
Patient would like refill of adderall. °

## 2012-11-14 NOTE — Telephone Encounter (Signed)
Spoke with pt advised Rx ready for pick up 

## 2012-12-16 ENCOUNTER — Telehealth: Payer: Self-pay

## 2012-12-16 DIAGNOSIS — F988 Other specified behavioral and emotional disorders with onset usually occurring in childhood and adolescence: Secondary | ICD-10-CM

## 2012-12-16 MED ORDER — AMPHETAMINE-DEXTROAMPHETAMINE 30 MG PO TABS: 30.0000 mg | ORAL_TABLET | Freq: Two times a day (BID) | ORAL | Status: DC

## 2012-12-16 NOTE — Telephone Encounter (Signed)
Pended please advise.  

## 2012-12-16 NOTE — Telephone Encounter (Signed)
Patient advised. She is advised she needs follow up before next Rx.
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
## 2012-12-16 NOTE — Telephone Encounter (Signed)
Written and ready to pick up

## 2012-12-31 ENCOUNTER — Other Ambulatory Visit: Payer: Self-pay | Admitting: Physician Assistant

## 2013-01-20 ENCOUNTER — Ambulatory Visit (INDEPENDENT_AMBULATORY_CARE_PROVIDER_SITE_OTHER): Payer: 59 | Admitting: Physician Assistant

## 2013-01-20 VITALS — BP 112/90 | HR 98 | Temp 97.9°F | Resp 16 | Ht 66.0 in | Wt 247.0 lb

## 2013-01-20 DIAGNOSIS — J45909 Unspecified asthma, uncomplicated: Secondary | ICD-10-CM

## 2013-01-20 DIAGNOSIS — J302 Other seasonal allergic rhinitis: Secondary | ICD-10-CM

## 2013-01-20 DIAGNOSIS — Z23 Encounter for immunization: Secondary | ICD-10-CM

## 2013-01-20 DIAGNOSIS — F988 Other specified behavioral and emotional disorders with onset usually occurring in childhood and adolescence: Secondary | ICD-10-CM | POA: Insufficient documentation

## 2013-01-20 DIAGNOSIS — J309 Allergic rhinitis, unspecified: Secondary | ICD-10-CM

## 2013-01-20 DIAGNOSIS — Z3041 Encounter for surveillance of contraceptive pills: Secondary | ICD-10-CM

## 2013-01-20 DIAGNOSIS — F329 Major depressive disorder, single episode, unspecified: Secondary | ICD-10-CM

## 2013-01-20 DIAGNOSIS — F341 Dysthymic disorder: Secondary | ICD-10-CM

## 2013-01-20 MED ORDER — ALBUTEROL SULFATE HFA 108 (90 BASE) MCG/ACT IN AERS: 2.0000 | INHALATION_SPRAY | RESPIRATORY_TRACT | Status: DC | PRN

## 2013-01-20 MED ORDER — LORAZEPAM 1 MG PO TABS: 1.0000 mg | ORAL_TABLET | Freq: Two times a day (BID) | ORAL | Status: DC | PRN

## 2013-01-20 MED ORDER — LISDEXAMFETAMINE DIMESYLATE 20 MG PO CAPS: 20.0000 mg | ORAL_CAPSULE | Freq: Every day | ORAL | Status: DC

## 2013-01-20 MED ORDER — TRAZODONE HCL 100 MG PO TABS: 100.0000 mg | ORAL_TABLET | Freq: Every evening | ORAL | Status: DC | PRN

## 2013-01-20 MED ORDER — TRIAMCINOLONE ACETONIDE 55 MCG/ACT NA AERO: 2.0000 | INHALATION_SPRAY | Freq: Every day | NASAL | Status: DC

## 2013-01-20 MED ORDER — MOMETASONE FURO-FORMOTEROL FUM 100-5 MCG/ACT IN AERO: 2.0000 | INHALATION_SPRAY | Freq: Two times a day (BID) | RESPIRATORY_TRACT | Status: DC

## 2013-01-20 MED ORDER — VENLAFAXINE HCL ER 75 MG PO CP24: 225.0000 mg | ORAL_CAPSULE | Freq: Every day | ORAL | Status: DC

## 2013-01-20 MED ORDER — NORGESTIMATE-ETH ESTRADIOL 0.25-35 MG-MCG PO TABS: 1.0000 | ORAL_TABLET | Freq: Every day | ORAL | Status: DC

## 2013-01-20 NOTE — Progress Notes (Signed)
Subjective:    Patient ID: Michele Barker, female    DOB: [birth year], 27 y.o.   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  She is happy - she is living there with her boyfriend.  She is busy all the time at work she feels like she needs a 3rd dose of Adderall during her 9h work day.  She takes the 1st dose at 7am and feels like she needs her 2nd dose about 11am.  When she takes the 2nd dose at 11 am she is unable to focus the last 2-3h of her work day.  As a result she as pushed her 2nd dose to 12-1 but she feels like it does not work at all.  When her dose starts to wear off she gets anxious and then once she gets anxious her attention gets even worse.  She feels like her morning dose works well and is the correct dose.  She is social at work and has very good focus but once her dose starts to wear off and she gets anxious she stays in her work space and stays to herself. She has been on the Adderall XR in the past but it did not help and made her feel bad. 2- Her generalized anxiety if the best it has been in a while.  She still has break-through anxiety related to stressful situations.  She has run out of her Ativan and the stress at work has definitely been worse in the last couple of weeks since her Adderall has seemed to not be working in the afternoon.  Her depression is still present but when her anxiety is controlled she is able to deal with her depression.  She is happy with her current living situation and really likes her new job.  She is traveling 2x/wk from [place] to visit her mom here in [place] which is stressful for her. 3- she is having wheezing only at night - her boyfriend will wake her up at night - she has run out of her albuterol - the problem with her wheezing started after she got a little cold  and she took mucinex which helped with her cold symptoms but did not stop the wheezing.  She uses her Dulera daily.  She always feels like her asthma is worse at night.   Review of Systems  Respiratory: Positive for wheezing (only at night - rarely occurs during the day).   Gastrointestinal:       No heartburn symptoms  Psychiatric/Behavioral: Positive for decreased concentration. The patient is nervous/anxious.        Objective:   Physical Exam  Vitals reviewed. Constitutional: She is oriented to person, place, and time. She appears well-developed and well-nourished.  HENT:  Head: Normocephalic and atraumatic.  Right Ear: External ear normal.  Left Ear: External ear normal.  Eyes: Conjunctivae are normal.  Neck: Normal range of motion.  Cardiovascular: Normal rate, regular rhythm and normal heart sounds.   No murmur heard. Pulmonary/Chest: Effort normal. She has wheezes (end expiratory at bases only with forced expiration).  Neurological: She is alert and oriented to person, place, and time.  Skin: Skin is warm and dry.  Psychiatric: She has a normal mood and affect. Her behavior is normal. Judgment and thought  content normal.          Assessment & Plan:  ADD (attention deficit disorder) - I am really not sure why her am dose is working well but the afternoon dose is not helping at all - but I think that when her dose starts to run out she gets situational anxiety and then once her anxiey starts she is unable to focus due to that and therefore the Adderall is not working. I am concerned about Adderall 30mg  tid for a 9 hr work day so we are going to try Vyvanse.  She will start with 2mg  and increase to 40mg  as she needs to.  We talked about a possible increase to 60mg  but she is going to call me prior to that increase and let me know what results igf any she is having with the 40mg  dose of Vyvanse.  Plan: lisdexamfetamine (VYVANSE) 20 MG capsule  Anxiety and depression - Her  anxiety has significantly improved on the Effexor but she is still having break-through situation anxiety about 2x/week which leads to difficulty treating her ADD.  We will increase the Effexor to see if that will help her anxiety as well as her depression.   The trazodone continues to help her sleep at night.  Plan: LORazepam (ATIVAN) 1 MG tablet, venlafaxine XR (EFFEXOR-XR) 75 MG 24 hr capsule, traZODone (DESYREL) 100 MG tablet  Asthma - We give pt more albuterol - she will use it prior to sleep and then during the night - if she gets results it is her wheezing - if she does not get results it may be her reflux acting up esp with the fact that she is only having symptoms in the evening while sleeping.  Plan: mometasone-formoterol (DULERA) 100-5 MCG/ACT AERO, albuterol (PROAIR HFA) 108 (90 BASE) MCG/ACT inhaler  Family planning, BCP (birth control pills) maintenance - Seeing a GYN due to an abnormal Pap plans to continue her f/u there..  Plan: norgestimate-ethinyl estradiol (MONONESSA) 0.25-35 MG-MCG tablet  Seasonal allergies - Continue current medications. Plan: triamcinolone (NASACORT) 55 MCG/ACT AERO nasal inhaler  Flu vaccine need - Plan: Flu Vaccine QUAD 36+ mos IM  She will call me within the month to tell me her progress and results with the med changes.  Benny Lennert PA-C 01/20/2013 9:17 PM

## 2013-01-21 ENCOUNTER — Telehealth: Payer: Self-pay

## 2013-01-21 DIAGNOSIS — F988 Other specified behavioral and emotional disorders with onset usually occurring in childhood and adolescence: Secondary | ICD-10-CM

## 2013-01-21 MED ORDER — AMPHETAMINE-DEXTROAMPHETAMINE 30 MG PO TABS: 30.0000 mg | ORAL_TABLET | Freq: Two times a day (BID) | ORAL | Status: DC

## 2013-01-21 NOTE — Telephone Encounter (Signed)
ready

## 2013-01-21 NOTE — Telephone Encounter (Signed)
Michele Barker,  Patient needs a prior auth on the vyvanse.  Can she get enough adderall to cover her till this is done.  She is losing it.   [phone]

## 2013-01-21 NOTE — Telephone Encounter (Signed)
Michele Barker,  Is it possible to give her enough adderrall to last until her vyvanse has a prior authorization done.

## 2013-01-21 NOTE — Telephone Encounter (Signed)
Spoke with the patient and let her know that rx was ready for pick up at [address] front desk

## 2013-01-24 ENCOUNTER — Telehealth: Payer: Self-pay | Admitting: Radiology

## 2013-01-24 NOTE — Telephone Encounter (Signed)
Discussed with Maralyn Sago, is one bid. Have faxed prior auth request to [facility]

## 2013-01-24 NOTE — Telephone Encounter (Signed)
Called for prior auth on the Vyvanse form being faxed, there will also be a quantity limit over ride, because it is written for 1-2 daily, I need you to change this either to one daily or two daily, please clarify.

## 2013-01-25 NOTE — Telephone Encounter (Signed)
Vyvanse approved, pharmacy advised.

## 2013-02-01 DIAGNOSIS — Z0271 Encounter for disability determination: Secondary | ICD-10-CM

## 2013-02-25 ENCOUNTER — Telehealth: Payer: Self-pay

## 2013-02-25 DIAGNOSIS — F329 Major depressive disorder, single episode, unspecified: Secondary | ICD-10-CM

## 2013-02-25 DIAGNOSIS — F988 Other specified behavioral and emotional disorders with onset usually occurring in childhood and adolescence: Secondary | ICD-10-CM

## 2013-02-25 DIAGNOSIS — F419 Anxiety disorder, unspecified: Secondary | ICD-10-CM

## 2013-02-25 NOTE — Telephone Encounter (Signed)
Patient states the Vyvanse is not working for her even when she takes up to 3 a day. She wants to know if she can be put back on Adderrall instead. Also needs a refill on Lorazepam.  [phone]

## 2013-03-01 MED ORDER — LORAZEPAM 1 MG PO TABS: 1.0000 mg | ORAL_TABLET | Freq: Two times a day (BID) | ORAL | Status: DC | PRN

## 2013-03-01 MED ORDER — AMPHETAMINE-DEXTROAMPHETAMINE 30 MG PO TABS: 30.0000 mg | ORAL_TABLET | Freq: Two times a day (BID) | ORAL | Status: DC

## 2013-03-01 NOTE — Telephone Encounter (Signed)
Advised pt rx ready for pick up and asked her to follow up with Benny LennertSarah Weber before the adderall rx runs out.

## 2013-03-01 NOTE — Telephone Encounter (Signed)
I can put her on Adderall but I am concerned about tid dosing - I will write this month for bid.  This will give me a little time to figure out our next step -

## 2013-03-03 ENCOUNTER — Other Ambulatory Visit: Payer: Self-pay | Admitting: Physician Assistant

## 2013-03-04 DIAGNOSIS — Z0271 Encounter for disability determination: Secondary | ICD-10-CM

## 2013-03-11 NOTE — Progress Notes (Signed)
PA approved for adderall through 03/08/14. Pharm notified.

## 2013-03-31 ENCOUNTER — Ambulatory Visit (INDEPENDENT_AMBULATORY_CARE_PROVIDER_SITE_OTHER): Payer: 59 | Admitting: Physician Assistant

## 2013-03-31 VITALS — BP 110/80 | HR 107 | Temp 97.8°F | Resp 16 | Ht 67.0 in | Wt 244.0 lb

## 2013-03-31 DIAGNOSIS — R059 Cough, unspecified: Secondary | ICD-10-CM

## 2013-03-31 DIAGNOSIS — F341 Dysthymic disorder: Secondary | ICD-10-CM

## 2013-03-31 DIAGNOSIS — F419 Anxiety disorder, unspecified: Secondary | ICD-10-CM

## 2013-03-31 DIAGNOSIS — F988 Other specified behavioral and emotional disorders with onset usually occurring in childhood and adolescence: Secondary | ICD-10-CM

## 2013-03-31 DIAGNOSIS — R05 Cough: Secondary | ICD-10-CM

## 2013-03-31 DIAGNOSIS — F329 Major depressive disorder, single episode, unspecified: Secondary | ICD-10-CM

## 2013-03-31 MED ORDER — LORAZEPAM 1 MG PO TABS: 1.0000 mg | ORAL_TABLET | Freq: Two times a day (BID) | ORAL | Status: DC | PRN

## 2013-03-31 MED ORDER — VENLAFAXINE HCL ER 75 MG PO CP24: ORAL_CAPSULE | ORAL | Status: DC

## 2013-03-31 MED ORDER — AMPHETAMINE-DEXTROAMPHETAMINE 20 MG PO TABS: 20.0000 mg | ORAL_TABLET | Freq: Three times a day (TID) | ORAL | Status: DC

## 2013-03-31 MED ORDER — HYDROCOD POLST-CHLORPHEN POLST 10-8 MG/5ML PO LQCR: 5.0000 mL | Freq: Two times a day (BID) | ORAL | Status: AC

## 2013-03-31 NOTE — Progress Notes (Signed)
Subjective:    Patient ID: Michele RiffleAvalin J Vanrossum, female    DOB: 12-Jan-1986, 28 y.o.   MRN: [id number]  HPI Pt presents to clinic for recheck.   1- she was diagnosed with sinus infection about 1 weeks ago and she feels much better but she still has a really deep cough that is only productive when she is laying down - it is dry during the day.  Mucus coming from the throat.  Some increased use of albuterol but not sure it is helping all that much. 2- needs a refill on her Adderall - when we tried the Vyvanse last month she found that it did not help her attention at all - she actually felt like she was less focused and scatter brained - trouble at work because not completing task - increased anxiety and actually got in trouble at work.  She has a new boss and she has recently gained more duties.  She takes the Adderall in the am and it works really good for 4 hours but in the afternoon she feels like her attention gets worse and then her anxiety increases and she has found that she has been using Ativan daily in the afternoon for her anxiety and once she gets her anxiety under control she is able to focus.  When her focus decreases she starts to worry and then she gets depressed thoughts.  She is sleeping well at night with the Trazodone. She feels like the Effexor helps her generalized anxiety but still needs rescue anxiety meds at work in the afternoon.  Review of Systems  Psychiatric/Behavioral: Positive for dysphoric mood and decreased concentration. Negative for sleep disturbance. The patient is nervous/anxious.        Objective:   Physical Exam  Vitals reviewed. Constitutional: She is oriented to person, place, and time. She appears well-developed and well-nourished.  HENT:  Head: Normocephalic and atraumatic.  Right Ear: External ear normal.  Left Ear: External ear normal.  Cardiovascular: Normal rate, regular rhythm and normal heart sounds.   Pulmonary/Chest: Effort normal and breath sounds  normal. She has no wheezes.  Neurological: She is alert and oriented to person, place, and time.  Skin: Skin is warm and dry.  Psychiatric: She has a normal mood and affect. Her behavior is normal. Judgment and thought content normal.       Assessment & Plan:  ADD (attention deficit disorder) - Plan: amphetamine-dextroamphetamine (ADDERALL) 20 MG tablet  Anxiety and depression - Plan: LORazepam (ATIVAN) 1 MG tablet, venlafaxine XR (EFFEXOR-XR) 75 MG 24 hr capsule  Cough - Plan: chlorpheniramine-HYDROcodone (TUSSIONEX PENNKINETIC ER) 10-8 MG/5ML LQCR  I really think that her anxiety is not as controlled as the patient thinks due to her increase used of Ativan during her work day.  I am concerned that her need for Adderall has increased with her new job duties.  I am not willing to increase her dose of Adderall above 60mg  a day.  We will try 20mg  tid and see if she gets improved results.  Another option is to add Buspar in the am to help with anxiety related to work or Xanax XR or maybe even a Social workertratterra in the am.  I really think that CBT would be the next best step for the patient because I think she is getting anxious about the amount of work she has to do and then getting overwhelmed which decreases her focus which makes her thinks her ADD is less controlled.  We will see  how the change in Adderall dose works and go from there.  Benny Lennert PA-C 03/31/2013 9:22 PM

## 2013-04-26 ENCOUNTER — Telehealth: Payer: Self-pay | Admitting: Physician Assistant

## 2013-04-26 NOTE — Telephone Encounter (Signed)
Will someone please call the patient and let her know that I have found a therapist in [place] that would be good for her. Lasandra BeechBrian Monteleone - [facility] [place] [phone]

## 2013-04-27 NOTE — Telephone Encounter (Signed)
Left message on pt's machine to call back.

## 2013-04-28 ENCOUNTER — Telehealth: Payer: Self-pay

## 2013-04-28 DIAGNOSIS — F988 Other specified behavioral and emotional disorders with onset usually occurring in childhood and adolescence: Secondary | ICD-10-CM

## 2013-04-28 MED ORDER — AMPHETAMINE-DEXTROAMPHETAMINE 20 MG PO TABS: 20.0000 mg | ORAL_TABLET | Freq: Three times a day (TID) | ORAL | Status: DC

## 2013-04-28 NOTE — Telephone Encounter (Signed)
Ready - please look at her other phone message to give her the name of a local therapist.

## 2013-04-28 NOTE — Telephone Encounter (Signed)
lmom to cb. 

## 2013-04-28 NOTE — Telephone Encounter (Signed)
Patient called returning phone call and requesting a refill on Adderall 20 mg 3x a day. Provider is Benny LennertSarah Weber.

## 2013-04-29 NOTE — Telephone Encounter (Signed)
Notified pt Rx ready and gave therapist info.

## 2013-04-29 NOTE — Telephone Encounter (Signed)
Gave pt info from Michele SagoSarah

## 2013-05-03 ENCOUNTER — Other Ambulatory Visit: Payer: Self-pay | Admitting: Physician Assistant

## 2013-05-17 ENCOUNTER — Ambulatory Visit (INDEPENDENT_AMBULATORY_CARE_PROVIDER_SITE_OTHER): Payer: 59 | Admitting: Internal Medicine

## 2013-05-17 VITALS — BP 132/82 | HR 117 | Temp 98.4°F | Resp 17 | Ht 66.0 in | Wt 246.0 lb

## 2013-05-17 DIAGNOSIS — Z9109 Other allergy status, other than to drugs and biological substances: Secondary | ICD-10-CM

## 2013-05-17 DIAGNOSIS — F988 Other specified behavioral and emotional disorders with onset usually occurring in childhood and adolescence: Secondary | ICD-10-CM

## 2013-05-17 DIAGNOSIS — F329 Major depressive disorder, single episode, unspecified: Secondary | ICD-10-CM

## 2013-05-17 DIAGNOSIS — F341 Dysthymic disorder: Secondary | ICD-10-CM

## 2013-05-17 DIAGNOSIS — F419 Anxiety disorder, unspecified: Principal | ICD-10-CM

## 2013-05-17 MED ORDER — LORAZEPAM 1 MG PO TABS: 1.0000 mg | ORAL_TABLET | Freq: Three times a day (TID) | ORAL | Status: DC | PRN

## 2013-05-17 MED ORDER — MOMETASONE FUROATE 50 MCG/ACT NA SUSP: 2.0000 | Freq: Every day | NASAL | Status: DC

## 2013-05-17 MED ORDER — MIRTAZAPINE 30 MG PO TABS: 30.0000 mg | ORAL_TABLET | Freq: Every day | ORAL | Status: DC

## 2013-05-17 MED ORDER — AMPHETAMINE-DEXTROAMPHETAMINE 20 MG PO TABS: 20.0000 mg | ORAL_TABLET | Freq: Three times a day (TID) | ORAL | Status: DC

## 2013-05-17 NOTE — Patient Instructions (Addendum)
Nasonex - look for a coupon - nasal spray  Dr Ledon SnareMcKnight - [phone] - [facility] [place]  [facility]  [address] [place], [place] [address] Phone: [phone] Fax: [phone]

## 2013-05-17 NOTE — Progress Notes (Signed)
Subjective:    Patient ID: Michele Barker, female    DOB: Jan 06, 1986, 28 y.o.   MRN: [id number]  HPI Pt presents to clinic for medication adjustment.  Her adjustment of her Adderall at the last visit has helped with her focus at work.  She now realizes that most of the problem is her uncontrolled anxiety and now her depression is starting to worsen. She is having sad and depressed thoughts a lot more she is starting to concentrate on the negatives and turn most things into negative thoughts.  She tried to get [place] but he is not taking new medications and that increased her anxiety because it took her a lot of courage to actually call the office.  She did call the mental health clinic in [place] and has been hooked up with a group for group sessions and she is looking forward to that to have support of other people with similar problems that she also has.  She feels like she is barely holding it together at work.  The new management is still making her have increased anxiety at work.  Her sleep is ok because of the Trazodone but she cannot take more because it makes her to sleepy and she still worries a lot when she is trying to go to sleep.  She has found that she goes to work and then she does not want to do anything once she has gotten home from work.  Her finance is worried about her.  She has support through her mom but her siblings tender to aggravate her anxiety and stress levels.  She has increased her stress eating and has gained a significiant amount of weight in the last year.  She has no SI or HI.  Review of Systems  Psychiatric/Behavioral: Positive for sleep disturbance, dysphoric mood and decreased concentration. Negative for suicidal ideas and self-injury. The patient is nervous/anxious.        Objective:   Physical Exam  Vitals reviewed. Constitutional: She is oriented to person, place, and time. She appears well-developed and  well-nourished.  HENT:  Head: Normocephalic and atraumatic.  Right Ear: External ear normal.  Left Ear: External ear normal.  Pulmonary/Chest: Effort normal.  Neurological: She is alert and oriented to person, place, and time.  Skin: Skin is warm and dry.  Psychiatric: She has a normal mood and affect. Her behavior is normal. Judgment and thought content normal.       Assessment & Plan:  Anxiety and depression - Plan: mirtazapine (REMERON) 30 MG tablet, LORazepam (ATIVAN) 1 MG tablet, Ambulatory referral to Psychiatry  Environmental allergies - Insurance does not cover Nasacort so she would like to change her intranasal steroid.  Plan: mometasone (NASONEX) 50 MCG/ACT nasal spray  ADD (attention deficit disorder) - Continue current dosage - she seems to have pretty good control and I think the patient now realizes that her problems at work are anxiety related.  Plan: Ambulatory referral to Psychiatry, amphetamine-dextroamphetamine (ADDERALL) 20 MG tablet  Pt has a significant history of anxiety and depression and currently her anxiety is not controlled which is causing increase in her depression.  Her anxiety has responded the best to her current dose of Effexor and while we are waiting on her referrals we will increase her Ativan because it does help her daily control of her anxiety and start Remeron for depression.  Abilify is an option but I am concerned about weight gain, we could also  try Neurontin but I would like to get another opinion through the [facility] in [place] and get her started with some CBT.  She is in agreement with this plan.  Benny Lennert PA-C  [facility] 05/17/2013 8:34 PM  I have reviewed and agree with documentation. Robert P. Merla Riches, M.D.

## 2013-05-18 ENCOUNTER — Telehealth: Payer: Self-pay

## 2013-05-18 NOTE — Telephone Encounter (Signed)
PA needed for nasonex. Pt has been on Nasacort prev but now that is OTC ins won't cover. LMOM for pt CB to ask if she has ever tried fluticasone in the past.

## 2013-05-23 NOTE — Telephone Encounter (Signed)
Pt stated that she thought she had tried another NS in past but doesn't remember the name. Stated that she would have gotten it from same pharm. Called pharm and they reported they only see H/O nasacort and triamcinolone which is generic for nasacort. Started to do PA and first question was if pt has tried/failed generic alternative, which is fluticasone. PA will not be approved bc pt has not tried fluticasone. Do you want to Rx trial of that? Pended.

## 2013-05-26 ENCOUNTER — Other Ambulatory Visit: Payer: Self-pay | Admitting: Physician Assistant

## 2013-05-26 MED ORDER — FLUTICASONE PROPIONATE 50 MCG/ACT NA SUSP: 2.0000 | Freq: Every day | NASAL | Status: DC

## 2013-05-26 NOTE — Telephone Encounter (Signed)
Please let pt know that I have sent in the nasal spray that her insurance says she has to try 1st.  Please have her let me know if it does not work.

## 2013-05-26 NOTE — Telephone Encounter (Signed)
Please call the patient with the info below.

## 2013-05-26 NOTE — Telephone Encounter (Signed)
Pt notified that rx was sent in and to call us if this does not work for her.

## 2013-05-27 ENCOUNTER — Other Ambulatory Visit: Payer: Self-pay | Admitting: Physician Assistant

## 2013-06-18 ENCOUNTER — Other Ambulatory Visit: Payer: Self-pay | Admitting: Physician Assistant

## 2013-06-20 ENCOUNTER — Other Ambulatory Visit: Payer: Self-pay

## 2013-06-20 DIAGNOSIS — F419 Anxiety disorder, unspecified: Principal | ICD-10-CM

## 2013-06-20 DIAGNOSIS — F329 Major depressive disorder, single episode, unspecified: Secondary | ICD-10-CM

## 2013-06-20 NOTE — Telephone Encounter (Signed)
Patient called wanting refills of Ativan 1mg  and Mirtazapine 30mg . Please call patient.

## 2013-06-20 NOTE — Telephone Encounter (Signed)
Pt stated that her 1st appt s/psychiatrist is tonight and she will get them to Rx what they want her to be on. Denied RF.

## 2013-06-21 MED ORDER — MIRTAZAPINE 30 MG PO TABS: 30.0000 mg | ORAL_TABLET | Freq: Every day | ORAL | Status: DC

## 2013-06-21 NOTE — Telephone Encounter (Signed)
Sarah, I sent in 1 RF of Mirtazapine w/note that pt needs OV for more. Pended Ativan w/ note d/t you wanting to f/up w/pt 1 mos from last OV 05/17/13.

## 2013-06-23 ENCOUNTER — Other Ambulatory Visit: Payer: Self-pay | Admitting: Physician Assistant

## 2013-06-23 MED ORDER — LORAZEPAM 1 MG PO TABS: 1.0000 mg | ORAL_TABLET | Freq: Three times a day (TID) | ORAL | Status: DC | PRN

## 2013-06-23 NOTE — Telephone Encounter (Signed)
Done

## 2013-06-23 NOTE — Telephone Encounter (Signed)
Faxed Ativan, notified pt done and of f/up needed. Pt agreed.

## 2013-06-29 ENCOUNTER — Telehealth: Payer: Self-pay

## 2013-06-29 DIAGNOSIS — F988 Other specified behavioral and emotional disorders with onset usually occurring in childhood and adolescence: Secondary | ICD-10-CM

## 2013-06-29 MED ORDER — AMPHETAMINE-DEXTROAMPHETAMINE 20 MG PO TABS: 20.0000 mg | ORAL_TABLET | Freq: Three times a day (TID) | ORAL | Status: DC

## 2013-06-29 NOTE — Telephone Encounter (Signed)
Pt notified that rx is up front for p/u 

## 2013-06-29 NOTE — Telephone Encounter (Signed)
Ready

## 2013-06-29 NOTE — Telephone Encounter (Signed)
Refill  amphetamine-dextroamphetamine (ADDERALL) 20 MG tablet   [phone]

## 2013-07-03 ENCOUNTER — Other Ambulatory Visit: Payer: Self-pay | Admitting: Physician Assistant

## 2013-07-07 ENCOUNTER — Telehealth: Payer: Self-pay | Admitting: Physician Assistant

## 2013-07-07 NOTE — Telephone Encounter (Signed)
Please fax information - to [phone]

## 2013-07-07 NOTE — Telephone Encounter (Signed)
[facility]  They are worried that she might have bipolar due to recent episodes of high energy.  Her mother has a diagnosis of bipolar.  They would like a med list of what we have tried in the past.

## 2013-07-14 ENCOUNTER — Ambulatory Visit (INDEPENDENT_AMBULATORY_CARE_PROVIDER_SITE_OTHER): Payer: 59 | Admitting: Physician Assistant

## 2013-07-14 ENCOUNTER — Encounter: Payer: Self-pay | Admitting: Physician Assistant

## 2013-07-14 ENCOUNTER — Other Ambulatory Visit: Payer: Self-pay | Admitting: Physician Assistant

## 2013-07-14 VITALS — BP 142/94 | HR 136 | Temp 99.0°F | Resp 20 | Ht 66.0 in | Wt 263.8 lb

## 2013-07-14 DIAGNOSIS — F329 Major depressive disorder, single episode, unspecified: Secondary | ICD-10-CM

## 2013-07-14 DIAGNOSIS — F419 Anxiety disorder, unspecified: Secondary | ICD-10-CM

## 2013-07-14 DIAGNOSIS — F39 Unspecified mood [affective] disorder: Secondary | ICD-10-CM

## 2013-07-14 DIAGNOSIS — F988 Other specified behavioral and emotional disorders with onset usually occurring in childhood and adolescence: Secondary | ICD-10-CM

## 2013-07-14 DIAGNOSIS — F341 Dysthymic disorder: Secondary | ICD-10-CM

## 2013-07-14 DIAGNOSIS — F32A Depression, unspecified: Secondary | ICD-10-CM

## 2013-07-14 MED ORDER — GABAPENTIN 100 MG PO CAPS: 100.0000 mg | ORAL_CAPSULE | Freq: Every day | ORAL | Status: DC

## 2013-07-14 NOTE — Telephone Encounter (Signed)
Will you please fax the letter than was written on 5/28 to Lauren at the fax number below.  Thanks

## 2013-07-14 NOTE — Progress Notes (Signed)
   Subjective:    Patient ID: Michele Barker, female    DOB: Sep 11, 1985, 28 y.o.   MRN: [id number]  HPI Pt presents to clinic for med recheck.  She has been going to therapy and really feels like it is helping.  She has really become aware of issues that she has been dealing with.  Things have been really stressful for her with her mom.  During therapy they have realized that she has a lot of mood swings and both her mother and sister are bipolar.  Her sleep is still disrupted but better.  Impulse shopping - recently she is veing very aware of that due to financial situation  Trouble sleeping - sometimes from thoughts and sometimes from more energy than normal  Review of Systems     Objective:   Physical Exam  Vitals reviewed. Constitutional: She is oriented to person, place, and time. She appears well-developed and well-nourished.  HENT:  Head: Normocephalic and atraumatic.  Right Ear: External ear normal.  Left Ear: External ear normal.  Pulmonary/Chest: Effort normal.  Neurological: She is alert and oriented to person, place, and time.  Psychiatric: She has a normal mood and affect. Her behavior is normal. Judgment and thought content normal.  Tearful       Assessment & Plan:  Mood disorder - Plan: gabapentin (NEURONTIN) 100 MG capsule  Continue current medications.  She will continue therapy.  Due to likely bipolar we will start Neurontin tonight.  She agrees with the plan.  Benny Lennert PA-C  [facility] 07/14/2013 8:42 PM

## 2013-07-18 NOTE — Telephone Encounter (Signed)
Michele Barker, pt reqs 90 day supply, is this OK?

## 2013-07-20 ENCOUNTER — Other Ambulatory Visit: Payer: Self-pay | Admitting: Physician Assistant

## 2013-07-25 NOTE — Telephone Encounter (Signed)
Letter faxed. Not sure what happened to this message from 07/07/13. Discussed with Benny Lennert PA-C

## 2013-07-27 ENCOUNTER — Telehealth: Payer: Self-pay | Admitting: Radiology

## 2013-07-27 NOTE — Telephone Encounter (Signed)
Faxed letter written by Benny Lennert to fax number [phone], per Benny Lennert

## 2013-07-28 ENCOUNTER — Other Ambulatory Visit: Payer: Self-pay | Admitting: Physician Assistant

## 2013-07-28 ENCOUNTER — Encounter: Payer: Self-pay | Admitting: Physician Assistant

## 2013-07-28 DIAGNOSIS — F419 Anxiety disorder, unspecified: Principal | ICD-10-CM

## 2013-07-28 DIAGNOSIS — F988 Other specified behavioral and emotional disorders with onset usually occurring in childhood and adolescence: Secondary | ICD-10-CM

## 2013-07-28 DIAGNOSIS — F329 Major depressive disorder, single episode, unspecified: Secondary | ICD-10-CM

## 2013-08-01 ENCOUNTER — Telehealth: Payer: Self-pay | Admitting: Physician Assistant

## 2013-08-01 DIAGNOSIS — F988 Other specified behavioral and emotional disorders with onset usually occurring in childhood and adolescence: Secondary | ICD-10-CM

## 2013-08-01 MED ORDER — DIAZEPAM 2 MG PO TABS: 1.0000 mg | ORAL_TABLET | Freq: Two times a day (BID) | ORAL | Status: DC | PRN

## 2013-08-01 MED ORDER — AMPHETAMINE-DEXTROAMPHETAMINE 20 MG PO TABS: 20.0000 mg | ORAL_TABLET | Freq: Three times a day (TID) | ORAL | Status: DC

## 2013-08-01 NOTE — Telephone Encounter (Signed)
Patient called to check the status of her Adderrall refill.   [phone]

## 2013-08-01 NOTE — Telephone Encounter (Signed)
Pt knows about medication.  Please send to the pharmacy.

## 2013-08-01 NOTE — Telephone Encounter (Signed)
Done

## 2013-08-01 NOTE — Telephone Encounter (Signed)
Pt notified that they are up front for p/u

## 2013-08-19 NOTE — Telephone Encounter (Signed)
Please contact this patient to clarify the medications.  Looks like she got adderall on 6/15?

## 2013-08-25 ENCOUNTER — Other Ambulatory Visit: Payer: Self-pay | Admitting: Physician Assistant

## 2013-08-25 ENCOUNTER — Encounter: Payer: Self-pay | Admitting: Physician Assistant

## 2013-08-25 DIAGNOSIS — F419 Anxiety disorder, unspecified: Secondary | ICD-10-CM

## 2013-08-25 DIAGNOSIS — F988 Other specified behavioral and emotional disorders with onset usually occurring in childhood and adolescence: Secondary | ICD-10-CM

## 2013-08-25 MED ORDER — AMPHETAMINE-DEXTROAMPHETAMINE 20 MG PO TABS: 20.0000 mg | ORAL_TABLET | Freq: Three times a day (TID) | ORAL | Status: DC

## 2013-08-25 MED ORDER — DIAZEPAM 2 MG PO TABS: 2.0000 mg | ORAL_TABLET | Freq: Two times a day (BID) | ORAL | Status: DC | PRN

## 2013-08-25 MED ORDER — GABAPENTIN 100 MG PO CAPS: ORAL_CAPSULE | ORAL | Status: DC

## 2013-08-25 NOTE — Telephone Encounter (Signed)
Please call in the Valium and she will come pick up the Adderall on Sunday.  She knows about the Rx up front so you do not need to call her.

## 2013-09-06 ENCOUNTER — Encounter: Payer: Self-pay | Admitting: Physician Assistant

## 2013-09-29 ENCOUNTER — Other Ambulatory Visit: Payer: Self-pay | Admitting: Physician Assistant

## 2013-09-29 DIAGNOSIS — F988 Other specified behavioral and emotional disorders with onset usually occurring in childhood and adolescence: Secondary | ICD-10-CM

## 2013-09-29 DIAGNOSIS — F419 Anxiety disorder, unspecified: Secondary | ICD-10-CM

## 2013-10-01 MED ORDER — DIAZEPAM 2 MG PO TABS: 2.0000 mg | ORAL_TABLET | Freq: Two times a day (BID) | ORAL | Status: DC | PRN

## 2013-10-01 MED ORDER — AMPHETAMINE-DEXTROAMPHETAMINE 20 MG PO TABS: 20.0000 mg | ORAL_TABLET | Freq: Three times a day (TID) | ORAL | Status: DC

## 2013-10-01 NOTE — Telephone Encounter (Signed)
Pt.notified

## 2013-10-01 NOTE — Telephone Encounter (Signed)
Done and ready to be picked up.

## 2013-10-24 ENCOUNTER — Other Ambulatory Visit: Payer: Self-pay | Admitting: Physician Assistant

## 2013-11-01 ENCOUNTER — Other Ambulatory Visit: Payer: Self-pay | Admitting: Physician Assistant

## 2013-11-01 DIAGNOSIS — F988 Other specified behavioral and emotional disorders with onset usually occurring in childhood and adolescence: Secondary | ICD-10-CM

## 2013-11-01 DIAGNOSIS — F419 Anxiety disorder, unspecified: Secondary | ICD-10-CM

## 2013-11-03 ENCOUNTER — Other Ambulatory Visit: Payer: Self-pay | Admitting: Physician Assistant

## 2013-11-03 ENCOUNTER — Encounter: Payer: Self-pay | Admitting: Physician Assistant

## 2013-11-03 DIAGNOSIS — F419 Anxiety disorder, unspecified: Secondary | ICD-10-CM

## 2013-11-03 MED ORDER — DIAZEPAM 5 MG PO TABS: 2.5000 mg | ORAL_TABLET | Freq: Two times a day (BID) | ORAL | Status: DC | PRN

## 2013-11-03 MED ORDER — AMPHETAMINE-DEXTROAMPHETAMINE 20 MG PO TABS: 20.0000 mg | ORAL_TABLET | Freq: Three times a day (TID) | ORAL | Status: DC

## 2013-11-29 ENCOUNTER — Other Ambulatory Visit: Payer: Self-pay | Admitting: Physician Assistant

## 2013-11-29 DIAGNOSIS — F988 Other specified behavioral and emotional disorders with onset usually occurring in childhood and adolescence: Secondary | ICD-10-CM

## 2013-11-29 DIAGNOSIS — F419 Anxiety disorder, unspecified: Secondary | ICD-10-CM

## 2013-12-01 MED ORDER — DIAZEPAM 5 MG PO TABS: 2.5000 mg | ORAL_TABLET | Freq: Two times a day (BID) | ORAL | Status: DC | PRN

## 2013-12-01 MED ORDER — AMPHETAMINE-DEXTROAMPHETAMINE 20 MG PO TABS: 20.0000 mg | ORAL_TABLET | Freq: Three times a day (TID) | ORAL | Status: DC

## 2013-12-01 NOTE — Telephone Encounter (Signed)
Done

## 2013-12-01 NOTE — Telephone Encounter (Signed)
Done.  Rx can just be put up front because I have communicated this to patient through my chart.

## 2013-12-28 ENCOUNTER — Other Ambulatory Visit: Payer: Self-pay | Admitting: Physician Assistant

## 2013-12-28 DIAGNOSIS — F988 Other specified behavioral and emotional disorders with onset usually occurring in childhood and adolescence: Secondary | ICD-10-CM

## 2013-12-28 DIAGNOSIS — F419 Anxiety disorder, unspecified: Secondary | ICD-10-CM

## 2013-12-29 MED ORDER — DIAZEPAM 5 MG PO TABS: 2.5000 mg | ORAL_TABLET | Freq: Two times a day (BID) | ORAL | Status: DC | PRN

## 2013-12-29 MED ORDER — AMPHETAMINE-DEXTROAMPHETAMINE 20 MG PO TABS: 20.0000 mg | ORAL_TABLET | Freq: Three times a day (TID) | ORAL | Status: DC

## 2013-12-29 NOTE — Telephone Encounter (Signed)
In drawer

## 2013-12-29 NOTE — Telephone Encounter (Signed)
Rx written - pat knows through my chart

## 2014-01-03 MED ORDER — GABAPENTIN 100 MG PO CAPS: 100.0000 mg | ORAL_CAPSULE | Freq: Two times a day (BID) | ORAL | Status: DC

## 2014-01-03 NOTE — Telephone Encounter (Signed)
Done

## 2014-01-26 ENCOUNTER — Other Ambulatory Visit: Payer: Self-pay | Admitting: Physician Assistant

## 2014-01-26 DIAGNOSIS — F988 Other specified behavioral and emotional disorders with onset usually occurring in childhood and adolescence: Secondary | ICD-10-CM

## 2014-01-26 MED ORDER — AMPHETAMINE-DEXTROAMPHETAMINE 20 MG PO TABS: 20.0000 mg | ORAL_TABLET | Freq: Three times a day (TID) | ORAL | Status: DC

## 2014-01-26 NOTE — Telephone Encounter (Signed)
Ready - pt knows by [employer]

## 2014-01-27 NOTE — Telephone Encounter (Signed)
In drawer

## 2014-01-31 ENCOUNTER — Other Ambulatory Visit: Payer: Self-pay | Admitting: Physician Assistant

## 2014-03-04 ENCOUNTER — Other Ambulatory Visit: Payer: Self-pay | Admitting: Physician Assistant

## 2014-03-04 ENCOUNTER — Ambulatory Visit (INDEPENDENT_AMBULATORY_CARE_PROVIDER_SITE_OTHER): Payer: 59 | Admitting: Physician Assistant

## 2014-03-04 VITALS — BP 110/84 | HR 107 | Temp 98.9°F | Resp 24 | Ht 66.25 in | Wt 269.1 lb

## 2014-03-04 DIAGNOSIS — Z3041 Encounter for surveillance of contraceptive pills: Secondary | ICD-10-CM

## 2014-03-04 DIAGNOSIS — F419 Anxiety disorder, unspecified: Secondary | ICD-10-CM

## 2014-03-04 DIAGNOSIS — J0101 Acute recurrent maxillary sinusitis: Secondary | ICD-10-CM

## 2014-03-04 DIAGNOSIS — Z23 Encounter for immunization: Secondary | ICD-10-CM

## 2014-03-04 DIAGNOSIS — J4520 Mild intermittent asthma, uncomplicated: Secondary | ICD-10-CM

## 2014-03-04 DIAGNOSIS — F909 Attention-deficit hyperactivity disorder, unspecified type: Secondary | ICD-10-CM

## 2014-03-04 DIAGNOSIS — F988 Other specified behavioral and emotional disorders with onset usually occurring in childhood and adolescence: Secondary | ICD-10-CM

## 2014-03-04 MED ORDER — ALBUTEROL SULFATE HFA 108 (90 BASE) MCG/ACT IN AERS: 2.0000 | INHALATION_SPRAY | RESPIRATORY_TRACT | Status: AC | PRN

## 2014-03-04 MED ORDER — MIRTAZAPINE 30 MG PO TABS: 30.0000 mg | ORAL_TABLET | Freq: Every day | ORAL | Status: DC

## 2014-03-04 MED ORDER — AMOXICILLIN 875 MG PO TABS: 875.0000 mg | ORAL_TABLET | Freq: Two times a day (BID) | ORAL | Status: DC

## 2014-03-04 MED ORDER — VENLAFAXINE HCL ER 75 MG PO CP24: 225.0000 mg | ORAL_CAPSULE | Freq: Every day | ORAL | Status: DC

## 2014-03-04 MED ORDER — DIAZEPAM 5 MG PO TABS: 2.5000 mg | ORAL_TABLET | Freq: Two times a day (BID) | ORAL | Status: DC | PRN

## 2014-03-04 MED ORDER — GABAPENTIN 100 MG PO CAPS: 100.0000 mg | ORAL_CAPSULE | Freq: Two times a day (BID) | ORAL | Status: DC

## 2014-03-04 MED ORDER — AMPHETAMINE-DEXTROAMPHETAMINE 20 MG PO TABS: 20.0000 mg | ORAL_TABLET | Freq: Three times a day (TID) | ORAL | Status: DC

## 2014-03-04 MED ORDER — NORGESTIMATE-ETH ESTRADIOL 0.25-35 MG-MCG PO TABS: 1.0000 | ORAL_TABLET | Freq: Every day | ORAL | Status: DC

## 2014-03-04 MED ORDER — MOMETASONE FURO-FORMOTEROL FUM 100-5 MCG/ACT IN AERO: 2.0000 | INHALATION_SPRAY | Freq: Two times a day (BID) | RESPIRATORY_TRACT | Status: DC

## 2014-03-04 NOTE — Patient Instructions (Signed)
[facility] to find it cheaper for the albuterol

## 2014-03-04 NOTE — Progress Notes (Signed)
Subjective:    Patient ID: Michele RiffleAvalin J Barker, female    DOB: [birth year], 29 y.o.   MRN: [id number]  HPI Pt presents to clinic for a recheck and medication refill.  ADD - she has been doing well on her current dose.  She finds that her attention at work is good.  Anxiety -  She feels like her anxiety is not completely controlled but it is better than it has been in a while.  She got a new job 3 months ago and the stress related to that is gone.  Her overall stress is so much better with the new job and not taking care of her mother.  But she still feels this internal nervousness most of the time.  She takes her valium every morning and it helps control the anxiety during the day and she rarely takes it at night and if she does it is a half dose.  She is sleeping well.  She feels like she is always in a state of waiting for something to happen because it always has in the past.  She wonders if she could relax about that if she would have less nervousness but she is not sure. This feeling has not gotten better over the last 3 months with the new job and 5 months not being her mother's caretaker.  [facility] due [facility].   Depression - well controlled - for the 1st time in a long time her mood is stabilized and her depression is not making her anxiety worse.  She feels like the fact that she is no longer caring for her mother has helped and their relationship is better. She still worries about her mom because her brother who is now her caretaker is not responsible but the patient knows this is best and she is enjoying being with her mom again as a daughter.  She is having sinus pressure and congestion over the last 2 months - she has been using mucinex and feels like she gets better but then her symptoms get worse again.  She has teeth pain in the upper teeth and some headaches but rare dizziness.     Review of Systems  Constitutional: Negative for fever  and chills.  HENT: Positive for congestion, rhinorrhea and sinus pressure.   Psychiatric/Behavioral: Positive for dysphoric mood (much better). Negative for sleep disturbance. The patient is nervous/anxious.        Objective:   Physical Exam  Constitutional: She is oriented to person, place, and time. She appears well-developed and well-nourished.  BP 110/84 mmHg  Pulse 107  Temp(Src) 98.9 F (37.2 C) (Oral)  Resp 24  Ht 5' 6.25" (1.683 m)  Wt 269 lb 2 oz (122.074 kg)  BMI 43.10 kg/m2  SpO2 95%  LMP 02/22/2014   HENT:  Head: Normocephalic and atraumatic.  Right Ear: Hearing, tympanic membrane, external ear and ear canal normal.  Left Ear: Hearing, tympanic membrane, external ear and ear canal normal.  Nose: Mucosal edema present.  Mouth/Throat: Uvula is midline, oropharynx is clear and moist and mucous membranes are normal.  Eyes: Conjunctivae are normal.  Neck: Normal range of motion.  Cardiovascular: Normal rate, regular rhythm and normal heart sounds.   No murmur heard. Pulmonary/Chest: Effort normal and breath sounds normal. She has no wheezes.  Neurological: She is alert and oriented to person, place, and time.  Skin: Skin is warm and dry.  Psychiatric: She has a  normal mood and affect. Her behavior is normal. Judgment and thought content normal.       Assessment & Plan:  Family planning, BCP (birth control pills) maintenance - Plan: norgestimate-ethinyl estradiol (MONONESSA) 0.25-35 MG-MCG tablet - had pap last year with gyn and was told q57yr paps after that.    Asthma, mild intermittent, uncomplicated - Her inhalers are expensive.  She may try [facility] for the albuterol and she will look for coupons for [facility]  If she is not able to find one she will call her insurance company to see which inhaled corticosteroid and long acting will be the cheapest for her.  Currently her asthma is controlled.  Plan: mometasone-formoterol (DULERA) 100-5 MCG/ACT AERO, albuterol (PROAIR  HFA) 108 (90 BASE) MCG/ACT inhaler  Anxiety - NOt completely controlled.  Will call and discuss with the therapist at [facility] about a possible increase in her effexor because that seems to have made the biggest difference in her anxiety.  Her valium use is better than it has been in a while. Plan: venlafaxine XR (EFFEXOR-XR) 75 MG 24 hr capsule, mirtazapine (REMERON) 30 MG tablet, gabapentin (NEURONTIN) 100 MG capsule, diazepam (VALIUM) 5 MG tablet  ADD (attention deficit disorder) - Continue current dose.  Plan: amphetamine-dextroamphetamine (ADDERALL) 20 MG tablet  Acute recurrent maxillary sinusitis - Plan: amoxicillin (AMOXIL) 875 MG tablet  Flu vaccine need - Plan: Flu Vaccine QUAD 36+ mos IM  Benny Lennert PA-C  [facility] 03/04/2014 5:13 PM

## 2014-03-07 ENCOUNTER — Encounter: Payer: Self-pay | Admitting: Physician Assistant

## 2014-03-07 ENCOUNTER — Other Ambulatory Visit: Payer: Self-pay | Admitting: Physician Assistant

## 2014-03-07 DIAGNOSIS — F419 Anxiety disorder, unspecified: Secondary | ICD-10-CM

## 2014-03-30 ENCOUNTER — Telehealth: Payer: Self-pay | Admitting: Physician Assistant

## 2014-03-30 DIAGNOSIS — F419 Anxiety disorder, unspecified: Secondary | ICD-10-CM

## 2014-03-30 DIAGNOSIS — F988 Other specified behavioral and emotional disorders with onset usually occurring in childhood and adolescence: Secondary | ICD-10-CM

## 2014-03-30 MED ORDER — VENLAFAXINE HCL ER 75 MG PO CP24: 225.0000 mg | ORAL_CAPSULE | Freq: Every day | ORAL | Status: DC

## 2014-03-30 MED ORDER — AMPHETAMINE-DEXTROAMPHETAMINE 20 MG PO TABS: 20.0000 mg | ORAL_TABLET | Freq: Three times a day (TID) | ORAL | Status: DC

## 2014-03-30 NOTE — Telephone Encounter (Signed)
Pt knows through my chart about the medication.  Please place up front for pick-up

## 2014-03-30 NOTE — Telephone Encounter (Signed)
Placed up front, rx ready for pick up

## 2014-04-04 ENCOUNTER — Other Ambulatory Visit: Payer: Self-pay | Admitting: Physician Assistant

## 2014-04-04 MED ORDER — VENLAFAXINE HCL ER 150 MG PO CP24: 300.0000 mg | ORAL_CAPSULE | Freq: Every day | ORAL | Status: DC

## 2014-04-04 NOTE — Telephone Encounter (Signed)
On Michele Barker's desk. I will start PA today.

## 2014-04-04 NOTE — Telephone Encounter (Signed)
Michele Barker, forwarding this to you to keep a look out for PA for Adderall.

## 2014-04-04 NOTE — Telephone Encounter (Signed)
Please be on the look for for a PA for the patient Adderall.  We have to do this yearly for her.

## 2014-04-05 NOTE — Telephone Encounter (Signed)
Called the number on the PA form from [facility] and they state the patient cannot find her in their system. I left a message for pt to call me back to see if she had a different insurance so I can call.

## 2014-04-22 ENCOUNTER — Encounter: Payer: Self-pay | Admitting: Physician Assistant

## 2014-04-25 ENCOUNTER — Other Ambulatory Visit: Payer: Self-pay | Admitting: Physician Assistant

## 2014-04-25 MED ORDER — AMOXICILLIN-POT CLAVULANATE 875-125 MG PO TABS: 1.0000 | ORAL_TABLET | Freq: Two times a day (BID) | ORAL | Status: DC

## 2014-04-25 NOTE — Telephone Encounter (Signed)
I have sent in Augmentin. 

## 2014-04-27 ENCOUNTER — Other Ambulatory Visit: Payer: Self-pay | Admitting: Physician Assistant

## 2014-04-27 NOTE — Telephone Encounter (Signed)
Ready - pt knows through mychart. 

## 2014-04-28 MED ORDER — EFFEXOR XR 150 MG PO CP24: 300.0000 mg | ORAL_CAPSULE | Freq: Every day | ORAL | Status: DC

## 2014-04-28 NOTE — Telephone Encounter (Signed)
Faxed

## 2014-05-15 ENCOUNTER — Other Ambulatory Visit: Payer: Self-pay | Admitting: Physician Assistant

## 2014-05-15 DIAGNOSIS — F988 Other specified behavioral and emotional disorders with onset usually occurring in childhood and adolescence: Secondary | ICD-10-CM

## 2014-05-18 MED ORDER — AMPHETAMINE-DEXTROAMPHETAMINE 20 MG PO TABS: 20.0000 mg | ORAL_TABLET | Freq: Three times a day (TID) | ORAL | Status: DC

## 2014-05-18 NOTE — Telephone Encounter (Signed)
Ready - pt knows through mychart - please note that her fiance Garth SchlatterKevin Hurley may be picking up her Rx on her envelople

## 2014-05-19 NOTE — Telephone Encounter (Signed)
Rx in drawer. Delaney Meigsamara will add note to envelope.

## 2014-05-24 ENCOUNTER — Telehealth: Payer: Self-pay

## 2014-05-24 NOTE — Telephone Encounter (Signed)
PA started for Adderall 20 mg. PA approved for a year. Faxed to pharm.

## 2014-06-14 ENCOUNTER — Encounter: Payer: Self-pay | Admitting: Physician Assistant

## 2014-06-14 ENCOUNTER — Telehealth: Payer: Self-pay | Admitting: Physician Assistant

## 2014-06-14 DIAGNOSIS — F988 Other specified behavioral and emotional disorders with onset usually occurring in childhood and adolescence: Secondary | ICD-10-CM

## 2014-06-16 ENCOUNTER — Other Ambulatory Visit: Payer: Self-pay

## 2014-06-16 MED ORDER — AMPHETAMINE-DEXTROAMPHETAMINE 20 MG PO TABS: 20.0000 mg | ORAL_TABLET | Freq: Three times a day (TID) | ORAL | Status: DC

## 2014-06-16 MED ORDER — DIAZEPAM 5 MG PO TABS: 5.0000 mg | ORAL_TABLET | Freq: Two times a day (BID) | ORAL | Status: DC | PRN

## 2014-06-16 NOTE — Addendum Note (Signed)
Addended by: Morrell RiddleWEBER, SARAH L on: 06/16/2014 11:50 AM   Modules accepted: Orders

## 2014-06-16 NOTE — Telephone Encounter (Signed)
Meds are ready - pt knows about this through mychart.

## 2014-06-16 NOTE — Telephone Encounter (Signed)
Rx already done. Can you call in her Valium please.

## 2014-07-12 ENCOUNTER — Other Ambulatory Visit: Payer: Self-pay | Admitting: Physician Assistant

## 2014-07-12 MED ORDER — AMPHETAMINE-DEXTROAMPHETAMINE 20 MG PO TABS: 20.0000 mg | ORAL_TABLET | Freq: Three times a day (TID) | ORAL | Status: DC

## 2014-07-12 NOTE — Telephone Encounter (Signed)
Adderall Rx is ready.  She knows through [employer].

## 2014-08-09 ENCOUNTER — Other Ambulatory Visit: Payer: Self-pay | Admitting: Physician Assistant

## 2014-08-09 DIAGNOSIS — F988 Other specified behavioral and emotional disorders with onset usually occurring in childhood and adolescence: Secondary | ICD-10-CM

## 2014-08-11 ENCOUNTER — Telehealth: Payer: Self-pay | Admitting: *Deleted

## 2014-08-11 MED ORDER — AMPHETAMINE-DEXTROAMPHETAMINE 20 MG PO TABS: 20.0000 mg | ORAL_TABLET | Freq: Three times a day (TID) | ORAL | Status: DC

## 2014-08-11 NOTE — Telephone Encounter (Signed)
I'll got it, Maralyn Sago.

## 2014-08-11 NOTE — Telephone Encounter (Signed)
Please let patient know, rx is ready for pick up. Thank you!

## 2014-08-11 NOTE — Addendum Note (Signed)
Addended by: Morrell Riddle on: 08/11/2014 11:59 AM   Modules accepted: Orders

## 2014-08-11 NOTE — Telephone Encounter (Signed)
Would some please print her a month of her adderall for me so she can pick up Sunday when she is in town.  Thanks so much.

## 2014-08-11 NOTE — Addendum Note (Signed)
Addended by: Wallis Bamberg on: 08/11/2014 12:08 PM   Modules accepted: Orders

## 2014-08-11 NOTE — Telephone Encounter (Signed)
Pt was called and informed that her medication of Adderall was ready for pickup.  Pt understood
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
## 2014-08-28 ENCOUNTER — Encounter: Payer: Self-pay | Admitting: Physician Assistant

## 2014-08-29 NOTE — Telephone Encounter (Signed)
PA approved for Effexor XR, case #  [id number]. Notified pharmacy and pt by MyChart.

## 2014-09-06 ENCOUNTER — Other Ambulatory Visit: Payer: Self-pay | Admitting: Urgent Care

## 2014-09-06 ENCOUNTER — Other Ambulatory Visit: Payer: Self-pay | Admitting: Physician Assistant

## 2014-09-06 DIAGNOSIS — F988 Other specified behavioral and emotional disorders with onset usually occurring in childhood and adolescence: Secondary | ICD-10-CM

## 2014-09-07 MED ORDER — AMPHETAMINE-DEXTROAMPHETAMINE 20 MG PO TABS: 20.0000 mg | ORAL_TABLET | Freq: Three times a day (TID) | ORAL | Status: DC

## 2014-09-07 NOTE — Addendum Note (Signed)
Addended by: Fernande Bras on: 09/07/2014 02:08 PM   Modules accepted: Orders

## 2014-09-07 NOTE — Telephone Encounter (Signed)
Looks like she's due for a follow-up with Maralyn Sago. Patient notified via My Chart.   Meds ordered this encounter  Medications  . amphetamine-dextroamphetamine (ADDERALL) 20 MG tablet    Sig: Take 1 tablet (20 mg total) by mouth 3 (three) times daily.    Dispense:  90 tablet    Refill:  0    Order Specific Question:  Supervising Provider    Answer:  DOOLITTLE, ROBERT P [[id number]]

## 2014-10-05 ENCOUNTER — Telehealth: Payer: Self-pay | Admitting: Physician Assistant

## 2014-10-05 DIAGNOSIS — F988 Other specified behavioral and emotional disorders with onset usually occurring in childhood and adolescence: Secondary | ICD-10-CM

## 2014-10-06 MED ORDER — AMPHETAMINE-DEXTROAMPHETAMINE 20 MG PO TABS: 20.0000 mg | ORAL_TABLET | Freq: Three times a day (TID) | ORAL | Status: DC

## 2014-10-06 NOTE — Addendum Note (Signed)
Addended by: Fernande Bras on: 10/06/2014 01:29 PM   Modules accepted: Orders

## 2014-10-06 NOTE — Telephone Encounter (Signed)
Patient notified via My Chart.  Meds ordered this encounter  Medications  . amphetamine-dextroamphetamine (ADDERALL) 20 MG tablet    Sig: Take 1 tablet (20 mg total) by mouth 3 (three) times daily.    Dispense:  90 tablet    Refill:  0    Order Specific Question:  Supervising Provider    Answer:  DOOLITTLE, ROBERT P [[id number]]     

## 2014-10-28 ENCOUNTER — Other Ambulatory Visit: Payer: Self-pay | Admitting: Physician Assistant

## 2014-10-29 ENCOUNTER — Encounter: Payer: Self-pay | Admitting: Physician Assistant

## 2014-11-02 MED ORDER — EFFEXOR XR 150 MG PO CP24
ORAL_CAPSULE | ORAL | Status: DC
Start: 2014-11-02 — End: 2014-11-27

## 2014-11-06 ENCOUNTER — Telehealth: Payer: Self-pay | Admitting: Physician Assistant

## 2014-11-06 DIAGNOSIS — F988 Other specified behavioral and emotional disorders with onset usually occurring in childhood and adolescence: Secondary | ICD-10-CM

## 2014-11-07 ENCOUNTER — Encounter: Payer: Self-pay | Admitting: Physician Assistant

## 2014-11-08 MED ORDER — AMPHETAMINE-DEXTROAMPHETAMINE 20 MG PO TABS: 20.0000 mg | ORAL_TABLET | Freq: Three times a day (TID) | ORAL | Status: DC

## 2014-11-08 NOTE — Telephone Encounter (Signed)
Rx in drawer. 

## 2014-11-08 NOTE — Telephone Encounter (Signed)
Pt knows through mychart 

## 2014-11-08 NOTE — Addendum Note (Signed)
Addended by: Morrell Riddle on: 11/08/2014 10:39 AM   Modules accepted: Orders

## 2014-11-24 ENCOUNTER — Telehealth: Payer: Self-pay | Admitting: Family Medicine

## 2014-11-24 NOTE — Telephone Encounter (Signed)
Spoke with patient about coming in for a visit.  She is going to come in the week of October 10.  Please go over her gabapentin, venlafaxine, major depression and or bipolar to clear up gaps.  Thank you.

## 2014-11-26 NOTE — Telephone Encounter (Signed)
Do I need to do something now for this patient?  I am not sure what you meant by this message.

## 2014-11-27 ENCOUNTER — Ambulatory Visit (INDEPENDENT_AMBULATORY_CARE_PROVIDER_SITE_OTHER): Payer: 59 | Admitting: Physician Assistant

## 2014-11-27 ENCOUNTER — Other Ambulatory Visit: Payer: Self-pay | Admitting: Physician Assistant

## 2014-11-27 VITALS — BP 141/100 | HR 124 | Temp 99.5°F | Resp 18 | Ht 66.0 in | Wt 275.0 lb

## 2014-11-27 DIAGNOSIS — F418 Other specified anxiety disorders: Secondary | ICD-10-CM | POA: Diagnosis not present

## 2014-11-27 DIAGNOSIS — Z23 Encounter for immunization: Secondary | ICD-10-CM

## 2014-11-27 DIAGNOSIS — R000 Tachycardia, unspecified: Secondary | ICD-10-CM

## 2014-11-27 DIAGNOSIS — R030 Elevated blood-pressure reading, without diagnosis of hypertension: Secondary | ICD-10-CM

## 2014-11-27 DIAGNOSIS — IMO0001 Reserved for inherently not codable concepts without codable children: Secondary | ICD-10-CM

## 2014-11-27 DIAGNOSIS — G4700 Insomnia, unspecified: Secondary | ICD-10-CM | POA: Diagnosis not present

## 2014-11-27 DIAGNOSIS — Z3041 Encounter for surveillance of contraceptive pills: Secondary | ICD-10-CM

## 2014-11-27 DIAGNOSIS — F988 Other specified behavioral and emotional disorders with onset usually occurring in childhood and adolescence: Secondary | ICD-10-CM

## 2014-11-27 DIAGNOSIS — F909 Attention-deficit hyperactivity disorder, unspecified type: Secondary | ICD-10-CM

## 2014-11-27 DIAGNOSIS — F329 Major depressive disorder, single episode, unspecified: Secondary | ICD-10-CM

## 2014-11-27 DIAGNOSIS — F419 Anxiety disorder, unspecified: Secondary | ICD-10-CM

## 2014-11-27 MED ORDER — MIRTAZAPINE 30 MG PO TABS: 30.0000 mg | ORAL_TABLET | Freq: Every day | ORAL | Status: DC

## 2014-11-27 MED ORDER — NORGESTIMATE-ETH ESTRADIOL 0.25-35 MG-MCG PO TABS: 1.0000 | ORAL_TABLET | Freq: Every day | ORAL | Status: DC

## 2014-11-27 MED ORDER — AMPHETAMINE-DEXTROAMPHETAMINE 20 MG PO TABS: 20.0000 mg | ORAL_TABLET | Freq: Three times a day (TID) | ORAL | Status: DC

## 2014-11-27 MED ORDER — METOPROLOL SUCCINATE ER 25 MG PO TB24: 25.0000 mg | ORAL_TABLET | Freq: Every day | ORAL | Status: DC

## 2014-11-27 MED ORDER — GABAPENTIN 100 MG PO CAPS: 100.0000 mg | ORAL_CAPSULE | Freq: Two times a day (BID) | ORAL | Status: DC

## 2014-11-27 MED ORDER — EFFEXOR XR 150 MG PO CP24: ORAL_CAPSULE | ORAL | Status: DC

## 2014-11-27 NOTE — Progress Notes (Signed)
Michele APPELHANS  MRN: [id number] DOB: 1986/01/30  Subjective:  Pt presents to clinic for a recheck.  She is doing really well.  Work is good - busy but good and she is making increased money with some overtime but she feels like she is handling the stress really well.  She has had some increased stress with her fiance recently lost his job within the last couple of week but she feels like she is handling it well.  She has started helping her mom a little more and she is still trying to keep her distance because her mother can be very demanding but none of her siblings are helping her mom and she feels like she needs to a little.  She knows that she needs to be careful but right now she is handling it ok.  In the ED last week because of a tooth problem - cannot afford to get dental care and that has been stressful.  She has not been taking her valium - none in 2 months - she thinks the gabapentin is really helping with her anxiety - she is excited that she no longer feels like she needs the Valium - in fact she got rid of it.    Rare albuterol use - less than once a month - only taking the dulera weekly No recent problems with GERD symptoms - if she has a flair up she will take a week or 2 of prilosec and then her symptoms will go away.  ADD - adderall still working really well for her symptoms - she only takes it for work.  She has noticed recently that her BP has been high and her heart rate has been high - it makes her feel nervous but it is different that her normal anxiety.  She does not have a BP cuff at home and she has never had problems with her BP in the past but she has not been feeling well recently.  She knows she has gained some weight but she is trying to eat healthy and she tries to walk some days of the week for exercise.  She is sleeping most nights - the Remeron really helps with her sleep  Patient Active Problem List   Diagnosis Date Noted  . ADD (attention deficit disorder)  01/20/2013  . Obesity 12/04/2011  . GERD (gastroesophageal reflux disease) 12/04/2011  . Tobacco abuse 12/04/2011  . Asthma 12/04/2011  . Anxiety and depression     Current Outpatient Prescriptions on File Prior to Visit  Medication Sig Dispense Refill  . albuterol (PROAIR HFA) 108 (90 BASE) MCG/ACT inhaler Inhale 2 puffs into the lungs every 4 (four) hours as needed for wheezing. 1 Inhaler 1  . amoxicillin (AMOXIL) 875 MG tablet Take 1 tablet (875 mg total) by mouth 2 (two) times daily. 20 tablet 0  . DULERA 100-5 MCG/ACT AERO INHALE 2 PUFFS BY MOUTH TWICE DAILY (Patient not taking: Reported on 11/27/2014) 3 Inhaler 1   No current facility-administered medications on file prior to visit.    No Known Allergies  Review of Systems Objective:  BP 141/100 mmHg  Pulse 124  Temp(Src) 99.5 F (37.5 C) (Oral)  Resp 18  Ht  (1.676 m)  Wt 275 lb (124.739 kg)  BMI 44.41 kg/m2  SpO2 96%  Physical Exam  Constitutional: She is oriented to person, place, and time and well-developed, well-nourished, and in no distress.  HENT:  Head: Normocephalic and atraumatic.  Right Ear: Hearing  and external ear normal.  Left Ear: Hearing and external ear normal.  Eyes: Conjunctivae are normal.  Neck: Normal range of motion.  Cardiovascular: Regular rhythm and normal heart sounds.  Tachycardia present.   No murmur heard. Pulmonary/Chest: Effort normal and breath sounds normal.  Neurological: She is alert and oriented to person, place, and time. Gait normal.  Skin: Skin is warm and dry.  Psychiatric: Mood, memory, affect and judgment normal.  Vitals reviewed.   Assessment and Plan :  ADD (attention deficit disorder) - Plan: amphetamine-dextroamphetamine (ADDERALL) 20 MG tablet - continue current dose  Anxiety and depression - Plan: EFFEXOR XR 150 MG 24 hr capsule, gabapentin (NEURONTIN) 100 MG capsule- continue current medication  Elevated BP- start Toprol and keep monitor   Flu vaccine  need - Plan: Flu Vaccine QUAD 36+ mos IM  Tachycardia - Plan: COMPLETE METABOLIC PANEL WITH GFR, TSH, metoprolol succinate (TOPROL-XL) 25 MG 24 hr tablet - check labs -   Encounter for surveillance of contraceptive pills - Plan: norgestimate-ethinyl estradiol (ORTHO-CYCLEN, 28,) 0.25-35 MG-MCG tablet - change to something cheaper - she will make and appt in 3 months for a CPE so we can repeat her pap smear - in 2014 she had colpo with HPV changes  Insomnia - Plan: mirtazapine (REMERON) 30 MG tablet - continue - we discussed this is possible a cause of her weight gain  Benny Lennert PA-C  [facility] 11/27/2014 9:26 PM

## 2014-11-28 LAB — COMPLETE METABOLIC PANEL WITH GFR
ALT: 11 U/L (ref 6–29)
AST: 21 U/L (ref 10–30)
Albumin: 3.8 g/dL (ref 3.6–5.1)
Alkaline Phosphatase: 87 U/L (ref 33–115)
BUN: 8 mg/dL (ref 7–25)
CHLORIDE: 100 mmol/L (ref 98–110)
CO2: 24 mmol/L (ref 20–31)
Calcium: 9.3 mg/dL (ref 8.6–10.2)
Creat: 0.66 mg/dL (ref 0.50–1.10)
Glucose, Bld: 72 mg/dL (ref 65–99)
Potassium: 4.2 mmol/L (ref 3.5–5.3)
Sodium: 137 mmol/L (ref 135–146)
Total Bilirubin: 0.3 mg/dL (ref 0.2–1.2)
Total Protein: 7 g/dL (ref 6.1–8.1)

## 2014-11-28 LAB — TSH: TSH: 6.268 u[IU]/mL — ABNORMAL HIGH (ref 0.350–4.500)

## 2014-11-30 LAB — T4, FREE: Free T4: 1.05 ng/dL (ref 0.80–1.80)

## 2015-01-03 ENCOUNTER — Telehealth: Payer: Self-pay | Admitting: Physician Assistant

## 2015-01-03 DIAGNOSIS — F988 Other specified behavioral and emotional disorders with onset usually occurring in childhood and adolescence: Secondary | ICD-10-CM

## 2015-01-04 ENCOUNTER — Telehealth: Payer: Self-pay

## 2015-01-04 NOTE — Telephone Encounter (Signed)
PA completed for brand name Effexor XR on covermymeds. Pt has tried venlafaxine, celexa and trazadone previously. Started on brand Effexor 04/28/14 with good effectiveness. PA pending.

## 2015-01-05 MED ORDER — AMPHETAMINE-DEXTROAMPHETAMINE 20 MG PO TABS: 20.0000 mg | ORAL_TABLET | Freq: Three times a day (TID) | ORAL | Status: DC

## 2015-01-05 NOTE — Telephone Encounter (Signed)
Ready - pt knows through mychart. 

## 2015-01-08 NOTE — Telephone Encounter (Signed)
PA was already prev approved per [facility] fax. Sent fax to pharm for instr's.

## 2015-02-01 ENCOUNTER — Telehealth: Payer: Self-pay | Admitting: Physician Assistant

## 2015-02-01 DIAGNOSIS — F988 Other specified behavioral and emotional disorders with onset usually occurring in childhood and adolescence: Secondary | ICD-10-CM

## 2015-02-03 MED ORDER — AMPHETAMINE-DEXTROAMPHETAMINE 20 MG PO TABS: 20.0000 mg | ORAL_TABLET | Freq: Three times a day (TID) | ORAL | Status: DC

## 2015-02-03 NOTE — Addendum Note (Signed)
Addended by: Morrell RiddleWEBER, Kennidy Lamke L on: 02/03/2015 02:17 PM   Modules accepted: Orders

## 2015-02-03 NOTE — Telephone Encounter (Signed)
Done - pt knows through my chart 

## 2015-02-04 NOTE — Telephone Encounter (Signed)
Pt notified that rx is ready for pickup at 102  

## 2015-03-13 ENCOUNTER — Other Ambulatory Visit: Payer: Self-pay | Admitting: Physician Assistant

## 2015-03-13 ENCOUNTER — Telehealth: Payer: Self-pay | Admitting: Physician Assistant

## 2015-03-13 DIAGNOSIS — F988 Other specified behavioral and emotional disorders with onset usually occurring in childhood and adolescence: Secondary | ICD-10-CM

## 2015-03-13 MED ORDER — AMPHETAMINE-DEXTROAMPHETAMINE 20 MG PO TABS: 20.0000 mg | ORAL_TABLET | Freq: Three times a day (TID) | ORAL | Status: DC

## 2015-03-13 NOTE — Telephone Encounter (Signed)
Ready for patient to pick-up she knows through [employer]

## 2015-03-13 NOTE — Addendum Note (Signed)
Addended by: Benny Lennert L on: 03/13/2015 05:00 PM   Modules accepted: Orders

## 2015-03-14 NOTE — Telephone Encounter (Signed)
Rx in drawer. 

## 2015-04-13 ENCOUNTER — Other Ambulatory Visit: Payer: Self-pay | Admitting: Physician Assistant

## 2015-04-16 ENCOUNTER — Telehealth: Payer: Self-pay

## 2015-04-16 DIAGNOSIS — F988 Other specified behavioral and emotional disorders with onset usually occurring in childhood and adolescence: Secondary | ICD-10-CM

## 2015-04-16 NOTE — Telephone Encounter (Signed)
Patient called in stating she needs her amphetamine-dextroamphetamine (ADDERALL) 20 MG tablet refilled, shows a refill request on the 24th but doesn't look like it was sent anywhere, can we see about getting this refilled thanks!

## 2015-04-17 MED ORDER — AMPHETAMINE-DEXTROAMPHETAMINE 20 MG PO TABS: 20.0000 mg | ORAL_TABLET | Freq: Three times a day (TID) | ORAL | Status: DC

## 2015-04-17 NOTE — Telephone Encounter (Signed)
Done - pt knows through my chart 

## 2015-04-17 NOTE — Telephone Encounter (Signed)
Rx in drawer. 

## 2015-05-10 ENCOUNTER — Other Ambulatory Visit: Payer: Self-pay | Admitting: Physician Assistant

## 2015-05-10 DIAGNOSIS — F988 Other specified behavioral and emotional disorders with onset usually occurring in childhood and adolescence: Secondary | ICD-10-CM

## 2015-05-10 MED ORDER — AMPHETAMINE-DEXTROAMPHETAMINE 20 MG PO TABS: 20.0000 mg | ORAL_TABLET | Freq: Three times a day (TID) | ORAL | Status: DC

## 2015-05-10 NOTE — Telephone Encounter (Signed)
Done - pt knows through my chart 

## 2015-05-11 NOTE — Telephone Encounter (Signed)
Rx in drawer. 

## 2015-05-24 ENCOUNTER — Encounter: Payer: Self-pay | Admitting: Physician Assistant

## 2015-05-24 ENCOUNTER — Ambulatory Visit (INDEPENDENT_AMBULATORY_CARE_PROVIDER_SITE_OTHER): Payer: 59 | Admitting: Physician Assistant

## 2015-05-24 VITALS — BP 114/84 | HR 101 | Temp 98.2°F | Resp 16 | Ht 66.5 in | Wt 263.6 lb

## 2015-05-24 DIAGNOSIS — F418 Other specified anxiety disorders: Secondary | ICD-10-CM

## 2015-05-24 DIAGNOSIS — F909 Attention-deficit hyperactivity disorder, unspecified type: Secondary | ICD-10-CM | POA: Diagnosis not present

## 2015-05-24 DIAGNOSIS — Z13228 Encounter for screening for other metabolic disorders: Secondary | ICD-10-CM | POA: Diagnosis not present

## 2015-05-24 DIAGNOSIS — Z130 Encounter for screening for diseases of the blood and blood-forming organs and certain disorders involving the immune mechanism: Secondary | ICD-10-CM | POA: Diagnosis not present

## 2015-05-24 DIAGNOSIS — Z114 Encounter for screening for human immunodeficiency virus [HIV]: Secondary | ICD-10-CM | POA: Diagnosis not present

## 2015-05-24 DIAGNOSIS — J0110 Acute frontal sinusitis, unspecified: Secondary | ICD-10-CM

## 2015-05-24 DIAGNOSIS — J4520 Mild intermittent asthma, uncomplicated: Secondary | ICD-10-CM | POA: Diagnosis not present

## 2015-05-24 DIAGNOSIS — F32A Depression, unspecified: Secondary | ICD-10-CM

## 2015-05-24 DIAGNOSIS — Z0000 Encounter for general adult medical examination without abnormal findings: Secondary | ICD-10-CM | POA: Diagnosis not present

## 2015-05-24 DIAGNOSIS — G4700 Insomnia, unspecified: Secondary | ICD-10-CM | POA: Diagnosis not present

## 2015-05-24 DIAGNOSIS — Z13220 Encounter for screening for lipoid disorders: Secondary | ICD-10-CM

## 2015-05-24 DIAGNOSIS — F329 Major depressive disorder, single episode, unspecified: Secondary | ICD-10-CM

## 2015-05-24 DIAGNOSIS — F988 Other specified behavioral and emotional disorders with onset usually occurring in childhood and adolescence: Secondary | ICD-10-CM

## 2015-05-24 DIAGNOSIS — K219 Gastro-esophageal reflux disease without esophagitis: Secondary | ICD-10-CM

## 2015-05-24 DIAGNOSIS — F419 Anxiety disorder, unspecified: Secondary | ICD-10-CM

## 2015-05-24 DIAGNOSIS — G44219 Episodic tension-type headache, not intractable: Secondary | ICD-10-CM | POA: Diagnosis not present

## 2015-05-24 DIAGNOSIS — Z3041 Encounter for surveillance of contraceptive pills: Secondary | ICD-10-CM | POA: Diagnosis not present

## 2015-05-24 LAB — COMPLETE METABOLIC PANEL WITH GFR
ALBUMIN: 4.1 g/dL (ref 3.6–5.1)
ALK PHOS: 84 U/L (ref 33–115)
ALT: 13 U/L (ref 6–29)
AST: 23 U/L (ref 10–30)
BILIRUBIN TOTAL: 0.2 mg/dL (ref 0.2–1.2)
BUN: 7 mg/dL (ref 7–25)
CO2: 20 mmol/L (ref 20–31)
Calcium: 9.6 mg/dL (ref 8.6–10.2)
Chloride: 104 mmol/L (ref 98–110)
Creat: 0.72 mg/dL (ref 0.50–1.10)
GFR, Est African American: >89 mL/min (ref >=60)
GFR, Est Non African American: >89 mL/min (ref >=60)
GLUCOSE: 94 mg/dL (ref 65–99)
POTASSIUM: 4.2 mmol/L (ref 3.5–5.3)
SODIUM: 141 mmol/L (ref 135–146)
TOTAL PROTEIN: 7.6 g/dL (ref 6.1–8.1)

## 2015-05-24 LAB — CBC WITH DIFFERENTIAL/PLATELET
BASOS PCT: 0 %
Basophils Absolute: 0 cells/uL (ref 0–200)
EOS PCT: 5 %
Eosinophils Absolute: 665 cells/uL — ABNORMAL HIGH (ref 15–500)
HCT: 42.7 % (ref 35.0–45.0)
HEMOGLOBIN: 14.2 g/dL (ref 11.7–15.5)
LYMPHS ABS: 3192 {cells}/uL (ref 850–3900)
Lymphocytes Relative: 24 %
MCH: 26.8 pg — ABNORMAL LOW (ref 27.0–33.0)
MCHC: 33.3 g/dL (ref 32.0–36.0)
MCV: 80.7 fL (ref 80.0–100.0)
MPV: 9.1 fL (ref 7.5–12.5)
Monocytes Absolute: 1197 cells/uL — ABNORMAL HIGH (ref 200–950)
Monocytes Relative: 9 %
NEUTROS ABS: 8246 {cells}/uL — AB (ref 1500–7800)
Neutrophils Relative %: 62 %
PLATELETS: 571 10*3/uL — AB (ref 140–400)
RBC: 5.29 MIL/uL — AB (ref 3.80–5.10)
RDW: 14.3 % (ref 11.0–15.0)
WBC: 13.3 10*3/uL — AB (ref 3.8–10.8)

## 2015-05-24 LAB — LIPID PANEL
CHOL/HDL RATIO: 5.3 ratio — AB (ref <=5.0)
Cholesterol: 250 mg/dL — ABNORMAL HIGH (ref 125–200)
HDL: 47 mg/dL (ref >=46)
Triglycerides: 452 mg/dL — ABNORMAL HIGH (ref <150)

## 2015-05-24 LAB — HIV ANTIBODY (ROUTINE TESTING W REFLEX): HIV: NONREACTIVE

## 2015-05-24 LAB — TSH: TSH: 1.21 m[IU]/L

## 2015-05-24 MED ORDER — EFFEXOR XR 150 MG PO CP24: ORAL_CAPSULE | ORAL | Status: DC

## 2015-05-24 MED ORDER — GABAPENTIN 100 MG PO CAPS
100.0000 mg | ORAL_CAPSULE | Freq: Two times a day (BID) | ORAL | Status: DC
Start: 2015-05-24 — End: 2016-01-17

## 2015-05-24 MED ORDER — NORGESTIMATE-ETH ESTRADIOL 0.25-35 MG-MCG PO TABS: 1.0000 | ORAL_TABLET | Freq: Every day | ORAL | Status: DC

## 2015-05-24 MED ORDER — CYCLOBENZAPRINE HCL 5 MG PO TABS: 5.0000 mg | ORAL_TABLET | Freq: Three times a day (TID) | ORAL | Status: DC | PRN

## 2015-05-24 MED ORDER — AMPHETAMINE-DEXTROAMPHETAMINE 20 MG PO TABS: 20.0000 mg | ORAL_TABLET | Freq: Three times a day (TID) | ORAL | Status: DC

## 2015-05-24 MED ORDER — METOPROLOL SUCCINATE ER 25 MG PO TB24: ORAL_TABLET | ORAL | Status: DC

## 2015-05-24 MED ORDER — MIRTAZAPINE 30 MG PO TABS: 30.0000 mg | ORAL_TABLET | Freq: Every day | ORAL | Status: DC

## 2015-05-24 MED ORDER — AMOXICILLIN-POT CLAVULANATE 875-125 MG PO TABS: 1.0000 | ORAL_TABLET | Freq: Two times a day (BID) | ORAL | Status: DC

## 2015-05-24 NOTE — Patient Instructions (Addendum)
IF you received an x-ray today, you will receive an invoice from [facility]. Please contact [facility] at [phone] with questions or concerns regarding your invoice.   IF you received labwork today, you will receive an invoice from [employer]. Please contact [facility] at [phone] with questions or concerns regarding your invoice.   Our billing staff will not be able to assist you with questions regarding bills from these companies.  You will be contacted with the lab results as soon as they are available. The fastest way to get your results is to activate your My Chart account. Instructions are located on the last page of this paperwork. If you have not heard from [place] regarding the results in 2 weeks, please contact this office.    healthHealth Maintenance, Female Adopting a healthy lifestyle and getting preventive care can go a long way to promote health and wellness. Talk with your health care provider about what schedule of regular examinations is right for you. This is a good chance for you to check in with your provider about disease prevention and staying healthy. In between checkups, there are plenty of things you can do on your own. Experts have done a lot of research about which lifestyle changes and preventive measures are most likely to keep you healthy. Ask your health care provider for more information. WEIGHT AND DIET  Eat a healthy diet  Be sure to include plenty of vegetables, fruits, low-fat dairy products, and lean protein.  Do not eat a lot of foods high in solid fats, added sugars, or salt.  Get regular exercise. This is one of the most important things you can do for your health.  Most adults should exercise for at least 150 minutes each week. The exercise should increase your heart rate and make you sweat (moderate-intensity exercise).  Most adults should also do strengthening exercises at least twice a week.  This is in addition to the moderate-intensity exercise.  Maintain a healthy weight  Body mass index (BMI) is a measurement that can be used to identify possible weight problems. It estimates body fat based on height and weight. Your health care provider can help determine your BMI and help you achieve or maintain a healthy weight.  For females 7 years of age and older:   A BMI below 18.5 is considered underweight.  A BMI of 18.5 to 24.9 is normal.  A BMI of 25 to 29.9 is considered overweight.  A BMI of 30 and above is considered obese.  Watch levels of cholesterol and blood lipids  You should start having your blood tested for lipids and cholesterol at 29 years of age, then have this test every 5 years.  You may need to have your cholesterol levels checked more often if:  Your lipid or cholesterol levels are high.  You are older than 29 years of age.  You are at high risk for heart disease.  CANCER SCREENING   Lung Cancer  Lung cancer screening is recommended for adults 78-46 years old who are at high risk for lung cancer because of a history of smoking.  A yearly low-dose CT scan of the lungs is recommended for people who:  Currently smoke.  Have quit within the past 15 years.  Have at least a 30-pack-year history of smoking. A pack year is smoking an average of one pack of cigarettes a day for 1 year.  Yearly screening should continue until it has been 15  years since you quit.  Yearly screening should stop if you develop a health problem that would prevent you from having lung cancer treatment.  Breast Cancer  Practice breast self-awareness. This means understanding how your breasts normally appear and feel.  It also means doing regular breast self-exams. Let your health care provider know about any changes, no matter how small.  If you are in your 20s or 30s, you should have a clinical breast exam (CBE) by a health care provider every 1-3 years as part of a  regular health exam.  If you are 20 or older, have a CBE every year. Also consider having a breast X-ray (mammogram) every year.  If you have a family history of breast cancer, talk to your health care provider about genetic screening.  If you are at high risk for breast cancer, talk to your health care provider about having an MRI and a mammogram every year.  Breast cancer gene (BRCA) assessment is recommended for women who have family members with BRCA-related cancers. BRCA-related cancers include:  Breast.  Ovarian.  Tubal.  Peritoneal cancers.  Results of the assessment will determine the need for genetic counseling and BRCA1 and BRCA2 testing. Cervical Cancer Your health care provider may recommend that you be screened regularly for cancer of the pelvic organs (ovaries, uterus, and vagina). This screening involves a pelvic examination, including checking for microscopic changes to the surface of your cervix (Pap test). You may be encouraged to have this screening done every 3 years, beginning at age 52.  For women ages 86-65, health care providers may recommend pelvic exams and Pap testing every 3 years, or they may recommend the Pap and pelvic exam, combined with testing for human papilloma virus (HPV), every 5 years. Some types of HPV increase your risk of cervical cancer. Testing for HPV may also be done on women of any age with unclear Pap test results.  Other health care providers may not recommend any screening for nonpregnant women who are considered low risk for pelvic cancer and who do not have symptoms. Ask your health care provider if a screening pelvic exam is right for you.  If you have had past treatment for cervical cancer or a condition that could lead to cancer, you need Pap tests and screening for cancer for at least 20 years after your treatment. If Pap tests have been discontinued, your risk factors (such as having a new sexual partner) need to be reassessed to  determine if screening should resume. Some women have medical problems that increase the chance of getting cervical cancer. In these cases, your health care provider may recommend more frequent screening and Pap tests. Colorectal Cancer  This type of cancer can be detected and often prevented.  Routine colorectal cancer screening usually begins at 29 years of age and continues through 29 years of age.  Your health care provider may recommend screening at an earlier age if you have risk factors for colon cancer.  Your health care provider may also recommend using home test kits to check for hidden blood in the stool.  A small camera at the end of a tube can be used to examine your colon directly (sigmoidoscopy or colonoscopy). This is done to check for the earliest forms of colorectal cancer.  Routine screening usually begins at age 50.  Direct examination of the colon should be repeated every 5-10 years through 29 years of age. However, you may need to be screened more often if early  forms of precancerous polyps or small growths are found. Skin Cancer  Check your skin from head to toe regularly.  Tell your health care provider about any new moles or changes in moles, especially if there is a change in a mole's shape or color.  Also tell your health care provider if you have a mole that is larger than the size of a pencil eraser.  Always use sunscreen. Apply sunscreen liberally and repeatedly throughout the day.  Protect yourself by wearing long sleeves, pants, a wide-brimmed hat, and sunglasses whenever you are outside. HEART DISEASE, DIABETES, AND HIGH BLOOD PRESSURE   High blood pressure causes heart disease and increases the risk of stroke. High blood pressure is more likely to develop in:  People who have blood pressure in the high end of the normal range (130-139/85-89 mm Hg).  People who are overweight or obese.  People who are African American.  If you are 18-39 years of  age, have your blood pressure checked every 3-5 years. If you are 46 years of age or older, have your blood pressure checked every year. You should have your blood pressure measured twice--once when you are at a hospital or clinic, and once when you are not at a hospital or clinic. Record the average of the two measurements. To check your blood pressure when you are not at a hospital or clinic, you can use:  An automated blood pressure machine at a pharmacy.  A home blood pressure monitor.  If you are between 41 years and 42 years old, ask your health care provider if you should take aspirin to prevent strokes.  Have regular diabetes screenings. This involves taking a blood sample to check your fasting blood sugar level.  If you are at a normal weight and have a low risk for diabetes, have this test once every three years after 29 years of age.  If you are overweight and have a high risk for diabetes, consider being tested at a younger age or more often. PREVENTING INFECTION  Hepatitis B  If you have a higher risk for hepatitis B, you should be screened for this virus. You are considered at high risk for hepatitis B if:  You were born in a country where hepatitis B is common. Ask your health care provider which countries are considered high risk.  Your parents were born in a high-risk country, and you have not been immunized against hepatitis B (hepatitis B vaccine).  You have HIV or AIDS.  You use needles to inject street drugs.  You live with someone who has hepatitis B.  You have had sex with someone who has hepatitis B.  You get hemodialysis treatment.  You take certain medicines for conditions, including cancer, organ transplantation, and autoimmune conditions. Hepatitis C  Blood testing is recommended for:  Everyone born from [birth year] through 1964.  Anyone with known risk factors for hepatitis C. Sexually transmitted infections (STIs)  You should be screened for sexually  transmitted infections (STIs) including gonorrhea and chlamydia if:  You are sexually active and are younger than 29 years of age.  You are older than 29 years of age and your health care provider tells you that you are at risk for this type of infection.  Your sexual activity has changed since you were last screened and you are at an increased risk for chlamydia or gonorrhea. Ask your health care provider if you are at risk.  If you do not have HIV, but are  at risk, it may be recommended that you take a prescription medicine daily to prevent HIV infection. This is called pre-exposure prophylaxis (PrEP). You are considered at risk if:  You are sexually active and do not regularly use condoms or know the HIV status of your partner(s).  You take drugs by injection.  You are sexually active with a partner who has HIV. Talk with your health care provider about whether you are at high risk of being infected with HIV. If you choose to begin PrEP, you should first be tested for HIV. You should then be tested every 3 months for as long as you are taking PrEP.  PREGNANCY   If you are premenopausal and you may become pregnant, ask your health care provider about preconception counseling.  If you may become pregnant, take 400 to 800 micrograms (mcg) of folic acid every day.  If you want to prevent pregnancy, talk to your health care provider about birth control (contraception). OSTEOPOROSIS AND MENOPAUSE   Osteoporosis is a disease in which the bones lose minerals and strength with aging. This can result in serious bone fractures. Your risk for osteoporosis can be identified using a bone density scan.  If you are 22 years of age or older, or if you are at risk for osteoporosis and fractures, ask your health care provider if you should be screened.  Ask your health care provider whether you should take a calcium or vitamin D supplement to lower your risk for osteoporosis.  Menopause may have  certain physical symptoms and risks.  Hormone replacement therapy may reduce some of these symptoms and risks. Talk to your health care provider about whether hormone replacement therapy is right for you.  HOME CARE INSTRUCTIONS   Schedule regular health, dental, and eye exams.  Stay current with your immunizations.   Do not use any tobacco products including cigarettes, chewing tobacco, or electronic cigarettes.  If you are pregnant, do not drink alcohol.  If you are breastfeeding, limit how much and how often you drink alcohol.  Limit alcohol intake to no more than 1 drink per day for nonpregnant women. One drink equals 12 ounces of beer, 5 ounces of wine, or 1 ounces of hard liquor.  Do not use street drugs.  Do not share needles.  Ask your health care provider for help if you need support or information about quitting drugs.  Tell your health care provider if you often feel depressed.  Tell your health care provider if you have ever been abused or do not feel safe at home.   This information is not intended to replace advice given to you by your health care provider. Make sure you discuss any questions you have with your health care provider.   Document Released: 08/19/2010 Document Revised: 02/24/2014 Document Reviewed: 01/05/2013 Elsevier Interactive Patient Education [employer].

## 2015-05-24 NOTE — Progress Notes (Signed)
Michele Barker  MRN: [id number] DOB: 12-06-1985  Subjective:  Pt presents to clinic for a CPE.  Pt has had sinus congestion for over a month but she is not having a lot of nasal drainage and her headaches are getting worse from the sinus pressure - she has been using mucinex, allegra and nasonex.  She has been having headaches and upper teeth pain - she thinks they are sinus related but that causes increase stress at work because of the pain and then that increases her anxiety which increases her headaches and neck tension.  Using a humidifer at night.  Last dental exam: over a year Last vision exam: wears glasses - about 2 years ago Last pap smear: 2014 - abnormal at that time - went and had colposcopy and everything was normal - she needs one but she is on her menses that is heavy flow still and she would like to wait to have this done.  Recent job promotion - excited but it comes with a lot of stress due to it being management  Vaccinations      Tetanus 2014      Patient Active Problem List   Diagnosis Date Noted  . ADD (attention deficit disorder) - controlled on medication 01/20/2013  . Obesity - trying to exercise but with the illness that has decreased it - weight has decreased by 12 lbs - eating healthy and hiking on the weekends 12/04/2011  . GERD (gastroesophageal reflux disease) - better with eating better 12/04/2011  . Tobacco abuse - 1/2 ppd 12/04/2011  . Asthma - no use at all 12/04/2011  . Anxiety and depression - doing ok - she has some increased in stress with new management but feels like it is still doing ok     Current Outpatient Prescriptions on File Prior to Visit  Medication Sig Dispense Refill  . albuterol (PROAIR HFA) 108 (90 BASE) MCG/ACT inhaler Inhale 2 puffs into the lungs every 4 (four) hours as needed for wheezing. (Patient not taking: Reported on 05/24/2015) 1 Inhaler 1   No current facility-administered medications on file prior to visit.    No  Known Allergies  Social History   Social History  . Marital Status: Single    Spouse Name: N/A  . Number of Children: N/A  . Years of Education: N/A   Occupational History  . Marketing    Social History Main Topics  . Smoking status: Current Every Day Smoker -- 0.50 packs/day for 10 years    Types: Cigarettes  . Smokeless tobacco: Never Used  . Alcohol Use: No     Comment: rare  . Drug Use: No  . Sexual Activity: Yes    Birth Control/ Protection: Pill   Other Topics Concern  . None   Social History Stage managerarrative   Marketing manager   Significant other   Education: College   Exercise: Yes    Past Surgical History  Procedure Laterality Date  . Extraction of wisdom teeth      Family History  Problem Relation Age of Onset  . Depression Mother     bipolar  . Hypothyroidism Mother   . Bipolar disorder Mother   . Hyperlipidemia Mother   . Heart disease Father   . Hyperlipidemia Father   . Hypertension Father   . Coronary artery disease Other   . Gout Maternal Grandfather   . Depression Sister     bipolar  . Hypothyroidism Sister   . Bipolar disorder Sister   .  Emphysema Maternal Grandmother   . Kidney disease Paternal Grandfather   . Hypertension Sister   . Learning disabilities Sister     Review of Systems  Constitutional: Negative.   HENT: Positive for congestion, nosebleeds (intermitent), rhinorrhea (minimal) and sinus pressure. Negative for postnasal drip.   Eyes: Negative.   Respiratory: Negative.   Cardiovascular: Negative.   Gastrointestinal: Negative.   Endocrine: Negative.   Genitourinary: Positive for menstrual problem (this month the timing was off).  Musculoskeletal: Negative.   Skin: Negative.   Allergic/Immunologic: Negative.   Neurological: Positive for dizziness (intermittent with the really bad headaches ) and headaches.  Hematological: Negative.   Psychiatric/Behavioral: Positive for dysphoric mood and decreased concentration. The  patient is nervous/anxious.    Objective:  BP 114/84 mmHg  Pulse 101  Temp(Src) 98.2 F (36.8 C) (Oral)  Resp 16  Ht 5' 6.5" (1.689 m)  Wt 263 lb 9.6 oz (119.568 kg)  BMI 41.91 kg/m2  SpO2 97%  LMP 05/19/2015  Physical Exam  Constitutional: She is oriented to person, place, and time and well-developed, well-nourished, and in no distress.  HENT:  Head: Normocephalic and atraumatic.  Right Ear: Hearing, tympanic membrane, external ear and ear canal normal.  Left Ear: Hearing, tympanic membrane, external ear and ear canal normal.  Nose: Right sinus exhibits frontal sinus tenderness. Right sinus exhibits no maxillary sinus tenderness. Left sinus exhibits maxillary sinus tenderness and frontal sinus tenderness.  Mouth/Throat: Uvula is midline, oropharynx is clear and moist and mucous membranes are normal.  Eyes: Conjunctivae and EOM are normal. Pupils are equal, round, and reactive to light.  Neck: Trachea normal and normal range of motion. Neck supple. No thyroid mass and no thyromegaly present.  Cardiovascular: Normal rate, regular rhythm and normal heart sounds.   No murmur heard. Pulmonary/Chest: Effort normal and breath sounds normal. She has no wheezes.  Abdominal: Soft. Bowel sounds are normal. There is no tenderness.  Musculoskeletal: Normal range of motion.       Cervical back: She exhibits spasm (trapezius).  Lymphadenopathy:    She has no cervical adenopathy.  Neurological: She is alert and oriented to person, place, and time. She has normal motor skills, normal sensation, normal strength and normal reflexes. Gait normal.  Skin: Skin is warm and dry.  Psychiatric: Mood, memory, affect and judgment normal.  Vitals reviewed.   Visual Acuity Screening   Right eye Left eye Both eyes  Without correction:     With correction:    Assessment and Plan :  Annual physical exam - anticipatory guidance given to patient  Gastroesophageal reflux disease,  esophagitis presence not specified - controlled currently and as long as this is controlled she has no problems with her asthma symptoms  Anxiety and depression - Plan: TSH, VITAMIN D 25 Hydroxy (Vit-D Deficiency, Fractures), gabapentin (NEURONTIN) 100 MG capsule, EFFEXOR XR 150 MG 24 hr capsule, metoprolol succinate (TOPROL-XL) 25 MG 24 hr tablet - doing well on her current medications and we plan on keeping the same dosing  ADD (attention deficit disorder) - Plan: amphetamine-dextroamphetamine (ADDERALL) 20 MG tablet - continue same medications  Screening for deficiency anemia - Plan: CBC with Differential/Platelet  Screening for metabolic disorder - Plan: COMPLETE METABOLIC PANEL WITH GFR  Screening cholesterol level - Plan: Lipid panel  Screening for HIV (human immunodeficiency virus) - Plan: HIV antibody  Encounter for surveillance of contraceptive pills - Plan: norgestimate-ethinyl estradiol (ORTHO-CYCLEN, 28,) 0.25-35 MG-MCG tablet  Asthma, mild intermittent, uncomplicated -  no problems  Insomnia - Plan: mirtazapine (REMERON) 30 MG tablet  Acute frontal sinusitis, recurrence not specified - Plan: amoxicillin-clavulanate (AUGMENTIN) 875-125 MG tablet - treat and pt to continue with her current allergy treatment regimen  Episodic tension-type headache, not intractable - Plan: cyclobenzaprine (FLEXERIL) 5 MG tablet  - trial of muscle relaxer to see if that will help the neck pain and headaches that feel like pulling on her neck  Pt deferred her gyn exam today due to menses and she will plan on getting back to see me in the next several months.  Benny Lennert PA-C  [facility] 05/24/2015 4:37 PM

## 2015-05-25 LAB — VITAMIN D 25 HYDROXY (VIT D DEFICIENCY, FRACTURES): Vit D, 25-Hydroxy: 29 ng/mL — ABNORMAL LOW (ref 30–100)

## 2015-07-05 ENCOUNTER — Telehealth: Payer: Self-pay | Admitting: Physician Assistant

## 2015-07-05 DIAGNOSIS — F988 Other specified behavioral and emotional disorders with onset usually occurring in childhood and adolescence: Secondary | ICD-10-CM

## 2015-07-05 DIAGNOSIS — G44219 Episodic tension-type headache, not intractable: Secondary | ICD-10-CM

## 2015-07-07 MED ORDER — AMPHETAMINE-DEXTROAMPHETAMINE 20 MG PO TABS: 20.0000 mg | ORAL_TABLET | Freq: Three times a day (TID) | ORAL | Status: DC

## 2015-07-07 MED ORDER — CYCLOBENZAPRINE HCL 5 MG PO TABS: 5.0000 mg | ORAL_TABLET | Freq: Three times a day (TID) | ORAL | Status: DC | PRN

## 2015-07-07 NOTE — Telephone Encounter (Signed)
Done.  Pt knows through mychart 

## 2015-07-09 NOTE — Telephone Encounter (Signed)
Rx in drawer. 

## 2015-08-08 ENCOUNTER — Telehealth: Payer: Self-pay | Admitting: Physician Assistant

## 2015-08-08 DIAGNOSIS — F988 Other specified behavioral and emotional disorders with onset usually occurring in childhood and adolescence: Secondary | ICD-10-CM

## 2015-08-08 DIAGNOSIS — G44219 Episodic tension-type headache, not intractable: Secondary | ICD-10-CM

## 2015-08-10 MED ORDER — CYCLOBENZAPRINE HCL 5 MG PO TABS: 5.0000 mg | ORAL_TABLET | Freq: Three times a day (TID) | ORAL | Status: DC | PRN

## 2015-08-10 MED ORDER — AMPHETAMINE-DEXTROAMPHETAMINE 20 MG PO TABS: 20.0000 mg | ORAL_TABLET | Freq: Three times a day (TID) | ORAL | Status: DC

## 2015-08-10 NOTE — Telephone Encounter (Signed)
Done

## 2015-08-10 NOTE — Addendum Note (Signed)
Addended by: Morrell RiddleWEBER, Eldred Sooy L on: 08/10/2015 03:58 PM   Modules accepted: Orders

## 2015-08-10 NOTE — Telephone Encounter (Signed)
Done.  Pt knows through mychart 

## 2015-09-10 ENCOUNTER — Telehealth: Payer: Self-pay | Admitting: Physician Assistant

## 2015-09-10 DIAGNOSIS — G44219 Episodic tension-type headache, not intractable: Secondary | ICD-10-CM

## 2015-09-10 DIAGNOSIS — F988 Other specified behavioral and emotional disorders with onset usually occurring in childhood and adolescence: Secondary | ICD-10-CM

## 2015-09-12 MED ORDER — AMPHETAMINE-DEXTROAMPHETAMINE 20 MG PO TABS: 20.0000 mg | ORAL_TABLET | Freq: Three times a day (TID) | ORAL | 0 refills | Status: DC

## 2015-09-12 MED ORDER — CYCLOBENZAPRINE HCL 5 MG PO TABS
5.0000 mg | ORAL_TABLET | Freq: Three times a day (TID) | ORAL | 0 refills | Status: DC | PRN
Start: 2015-09-12 — End: 2015-11-10

## 2015-09-12 NOTE — Telephone Encounter (Signed)
OK ready to pick up - she knows through [employer]

## 2015-09-13 NOTE — Telephone Encounter (Signed)
Rx in drawer. 

## 2015-09-14 ENCOUNTER — Telehealth: Payer: Self-pay

## 2015-09-14 NOTE — Telephone Encounter (Signed)
PA is needed for brand name Effexor XR 150, 2 caps QD. PA completed on covermymeds. Pt has tried venlafaxine, celexa and trazadone previously. Started on brand Effexor 04/28/14 with good effectiveness. PA approved immediately and pharm notified.

## 2015-10-10 ENCOUNTER — Other Ambulatory Visit: Payer: Self-pay | Admitting: Physician Assistant

## 2015-10-10 DIAGNOSIS — F419 Anxiety disorder, unspecified: Secondary | ICD-10-CM

## 2015-10-10 DIAGNOSIS — F329 Major depressive disorder, single episode, unspecified: Secondary | ICD-10-CM

## 2015-10-10 DIAGNOSIS — F988 Other specified behavioral and emotional disorders with onset usually occurring in childhood and adolescence: Secondary | ICD-10-CM

## 2015-10-13 MED ORDER — VENLAFAXINE HCL ER 150 MG PO CP24: ORAL_CAPSULE | ORAL | 3 refills | Status: DC

## 2015-10-13 MED ORDER — AMPHETAMINE-DEXTROAMPHETAMINE 20 MG PO TABS: 20.0000 mg | ORAL_TABLET | Freq: Three times a day (TID) | ORAL | 0 refills | Status: DC

## 2015-10-13 NOTE — Telephone Encounter (Signed)
Done - pt knows through my chart 

## 2015-11-10 ENCOUNTER — Other Ambulatory Visit: Payer: Self-pay | Admitting: Physician Assistant

## 2015-11-10 DIAGNOSIS — G44219 Episodic tension-type headache, not intractable: Secondary | ICD-10-CM

## 2015-11-10 DIAGNOSIS — F988 Other specified behavioral and emotional disorders with onset usually occurring in childhood and adolescence: Secondary | ICD-10-CM

## 2015-11-12 ENCOUNTER — Other Ambulatory Visit: Payer: Self-pay

## 2015-11-12 MED ORDER — CYCLOBENZAPRINE HCL 5 MG PO TABS
5.0000 mg | ORAL_TABLET | Freq: Three times a day (TID) | ORAL | 0 refills | Status: DC | PRN
Start: 2015-11-12 — End: 2016-08-12

## 2015-11-12 NOTE — Telephone Encounter (Signed)
Sent in 1 mos RF of flexeril. Pt last seen in April, due for f/up in Oct. Last adderall Rf 8/26.

## 2015-11-13 MED ORDER — AMPHETAMINE-DEXTROAMPHETAMINE 20 MG PO TABS: 20.0000 mg | ORAL_TABLET | Freq: Three times a day (TID) | ORAL | 0 refills | Status: DC

## 2015-11-13 NOTE — Telephone Encounter (Signed)
Done

## 2015-11-13 NOTE — Telephone Encounter (Signed)
Notified pt on Mychart that Rx is ready and need for f/up in Oct.

## 2015-12-14 ENCOUNTER — Encounter: Payer: Self-pay | Admitting: Physician Assistant

## 2015-12-14 ENCOUNTER — Other Ambulatory Visit: Payer: Self-pay | Admitting: Physician Assistant

## 2015-12-14 DIAGNOSIS — F988 Other specified behavioral and emotional disorders with onset usually occurring in childhood and adolescence: Secondary | ICD-10-CM

## 2015-12-14 MED ORDER — AMPHETAMINE-DEXTROAMPHETAMINE 20 MG PO TABS: 20.0000 mg | ORAL_TABLET | Freq: Three times a day (TID) | ORAL | 0 refills | Status: DC

## 2015-12-14 NOTE — Telephone Encounter (Signed)
Last OV 4/6. Last RF 9/26. Pended Rx

## 2015-12-14 NOTE — Telephone Encounter (Signed)
Done - pt knows through my chart 

## 2016-01-17 ENCOUNTER — Ambulatory Visit (INDEPENDENT_AMBULATORY_CARE_PROVIDER_SITE_OTHER): Payer: Self-pay | Admitting: Physician Assistant

## 2016-01-17 VITALS — BP 122/82 | HR 88 | Temp 98.4°F | Resp 17 | Ht 66.5 in | Wt 253.0 lb

## 2016-01-17 DIAGNOSIS — F419 Anxiety disorder, unspecified: Secondary | ICD-10-CM

## 2016-01-17 DIAGNOSIS — F329 Major depressive disorder, single episode, unspecified: Secondary | ICD-10-CM

## 2016-01-17 DIAGNOSIS — F418 Other specified anxiety disorders: Secondary | ICD-10-CM

## 2016-01-17 DIAGNOSIS — F988 Other specified behavioral and emotional disorders with onset usually occurring in childhood and adolescence: Secondary | ICD-10-CM

## 2016-01-17 MED ORDER — AMPHETAMINE-DEXTROAMPHETAMINE 20 MG PO TABS: 20.0000 mg | ORAL_TABLET | Freq: Three times a day (TID) | ORAL | 0 refills | Status: DC

## 2016-01-17 MED ORDER — GABAPENTIN 100 MG PO CAPS: ORAL_CAPSULE | ORAL | 3 refills | Status: DC

## 2016-01-17 NOTE — Patient Instructions (Signed)
     IF you received an x-ray today, you will receive an invoice from [facility]. Please contact [facility] at [phone] with questions or concerns regarding your invoice.   IF you received labwork today, you will receive an invoice from [facility]. Please contact [facility] at [phone] with questions or concerns regarding your invoice.   Our billing staff will not be able to assist you with questions regarding bills from these companies.  You will be contacted with the lab results as soon as they are available. The fastest way to get your results is to activate your My Chart account. Instructions are located on the last page of this paperwork. If you have not heard from us regarding the results in 2 weeks, please contact this office.      

## 2016-01-17 NOTE — Progress Notes (Signed)
Michele Barker  MRN: [id number] DOB: 06-Jun-1985  Subjective:  Pt presents to clinic for medication refills.  She is doing well - she recently moved back to [place] and now has an hour long commute but she is much happier than when she was living in [place].  She still dose not have insurance and she had to stop the remeron because it was just to expensive with all her other medications and this is the only one that she felt comfortable stopping - she has increased her gabapentin to 200mg  at night and that helped with sleep but she has noticed that her mood is slightly depressed compared to when she was on the remeron.  She feels like she is sleeping ok with the increase in her gabapentin nighttime dose.  She has been able to walk since moving out of [place] and she has noticed some weight loss.  She knows she needs a CPE but she would like to wait until she has insurance - her last pap smear was within 3 years and it was normal - it was done after her normal colposcopy.  Review of Systems  Constitutional: Negative for appetite change, chills, fever and unexpected weight change.  Cardiovascular: Negative for chest pain and palpitations.    Patient Active Problem List   Diagnosis Date Noted  . ADD (attention deficit disorder) 01/20/2013  . Obesity 12/04/2011  . GERD (gastroesophageal reflux disease) 12/04/2011  . Tobacco abuse 12/04/2011  . Asthma 12/04/2011  . Anxiety and depression     Current Outpatient Prescriptions on File Prior to Visit  Medication Sig Dispense Refill  . albuterol (PROAIR HFA) 108 (90 BASE) MCG/ACT inhaler Inhale 2 puffs into the lungs every 4 (four) hours as needed for wheezing. 1 Inhaler 1  . cyclobenzaprine (FLEXERIL) 5 MG tablet Take 1 tablet (5 mg total) by mouth 3 (three) times daily as needed for muscle spasms. 30 tablet 0  . metoprolol succinate (TOPROL-XL) 25 MG 24 hr tablet TAKE 1 TABLET(25 MG) BY MOUTH DAILY 90 tablet 3  . mirtazapine (REMERON) 30  MG tablet Take 1 tablet (30 mg total) by mouth at bedtime. 90 tablet 3  . norgestimate-ethinyl estradiol (ORTHO-CYCLEN, 28,) 0.25-35 MG-MCG tablet Take 1 tablet by mouth daily. 3 Package 4  . venlafaxine XR (EFFEXOR XR) 150 MG 24 hr capsule TAKE 2 CAPSULES BY MOUTH EVERY DAY WITH BREAKFAST. 180 capsule 3   No current facility-administered medications on file prior to visit.     No Known Allergies  Pt patients past, family and social history were reviewed and updated.   Objective:  BP 122/82 (BP Location: Right Arm, Patient Position: Sitting, Cuff Size: Large)   Pulse 88   Temp 98.4 F (36.9 C) (Oral)   Resp 17   Ht 5' 6.5" (1.689 m)   Wt 253 lb (114.8 kg)   LMP 01/03/2016 (Approximate)   SpO2 96%   BMI 40.22 kg/m   Physical Exam  Constitutional: She is oriented to person, place, and time and well-developed, well-nourished, and in no distress.  HENT:  Head: Normocephalic and atraumatic.  Right Ear: Hearing and external ear normal.  Left Ear: Hearing and external ear normal.  Eyes: Conjunctivae are normal.  Neck: Normal range of motion.  Cardiovascular: Normal rate, regular rhythm and normal heart sounds.   No murmur heard. Pulmonary/Chest: Effort normal and breath sounds normal. She has no wheezes.  Neurological: She is alert and oriented to person, place, and time. Gait normal.  Skin:  Skin is warm and dry.  Psychiatric: Mood, memory, affect and judgment normal.  Vitals reviewed.   Assessment and Plan :  Attention deficit disorder (ADD) without hyperactivity  Anxiety and depression - Plan: gabapentin (NEURONTIN) 100 MG capsule, amphetamine-dextroamphetamine (ADDERALL) 20 MG tablet, amphetamine-dextroamphetamine (ADDERALL) 20 MG tablet, amphetamine-dextroamphetamine (ADDERALL) 20 MG tablet   Medications refills.  Pt will need to sign controlled substance contract at nexrt visit.  We will increase her gabapentin to 100mg  in the am and 200mg  qhs.  Benny LennertSarah Weber PA-C  [facility] 01/17/2016 4:10 PM

## 2016-01-23 ENCOUNTER — Encounter: Payer: Self-pay | Admitting: Physician Assistant

## 2016-04-10 ENCOUNTER — Other Ambulatory Visit: Payer: Self-pay | Admitting: Physician Assistant

## 2016-04-10 DIAGNOSIS — F329 Major depressive disorder, single episode, unspecified: Secondary | ICD-10-CM

## 2016-04-10 DIAGNOSIS — F32A Depression, unspecified: Secondary | ICD-10-CM

## 2016-04-10 DIAGNOSIS — F419 Anxiety disorder, unspecified: Principal | ICD-10-CM

## 2016-04-11 MED ORDER — AMPHETAMINE-DEXTROAMPHETAMINE 20 MG PO TABS: 20.0000 mg | ORAL_TABLET | Freq: Three times a day (TID) | ORAL | 0 refills | Status: DC

## 2016-04-11 NOTE — Telephone Encounter (Signed)
done

## 2016-06-16 ENCOUNTER — Other Ambulatory Visit: Payer: Self-pay | Admitting: Physician Assistant

## 2016-06-16 DIAGNOSIS — Z3041 Encounter for surveillance of contraceptive pills: Secondary | ICD-10-CM

## 2016-07-10 ENCOUNTER — Other Ambulatory Visit: Payer: Self-pay | Admitting: Physician Assistant

## 2016-07-10 ENCOUNTER — Encounter: Payer: Self-pay | Admitting: Physician Assistant

## 2016-07-10 DIAGNOSIS — Z3041 Encounter for surveillance of contraceptive pills: Secondary | ICD-10-CM

## 2016-07-10 DIAGNOSIS — F329 Major depressive disorder, single episode, unspecified: Secondary | ICD-10-CM

## 2016-07-10 DIAGNOSIS — F32A Depression, unspecified: Secondary | ICD-10-CM

## 2016-07-10 DIAGNOSIS — F419 Anxiety disorder, unspecified: Principal | ICD-10-CM

## 2016-07-10 NOTE — Telephone Encounter (Signed)
12/2015 last ov 

## 2016-07-11 MED ORDER — GABAPENTIN 100 MG PO CAPS: ORAL_CAPSULE | ORAL | 3 refills | Status: DC

## 2016-07-11 MED ORDER — VENLAFAXINE HCL ER 150 MG PO CP24: ORAL_CAPSULE | ORAL | 3 refills | Status: DC

## 2016-07-11 MED ORDER — NORGESTIMATE-ETH ESTRADIOL 0.25-35 MG-MCG PO TABS: 1.0000 | ORAL_TABLET | Freq: Every day | ORAL | 1 refills | Status: DC

## 2016-07-11 MED ORDER — AMPHETAMINE-DEXTROAMPHETAMINE 20 MG PO TABS: 20.0000 mg | ORAL_TABLET | Freq: Three times a day (TID) | ORAL | 0 refills | Status: DC

## 2016-07-11 NOTE — Telephone Encounter (Signed)
Done - pt knows through my chart 

## 2016-07-11 NOTE — Telephone Encounter (Signed)
Already did from another message

## 2016-08-12 ENCOUNTER — Ambulatory Visit (INDEPENDENT_AMBULATORY_CARE_PROVIDER_SITE_OTHER): Payer: Self-pay | Admitting: Physician Assistant

## 2016-08-12 ENCOUNTER — Encounter: Payer: Self-pay | Admitting: Physician Assistant

## 2016-08-12 DIAGNOSIS — G4700 Insomnia, unspecified: Secondary | ICD-10-CM

## 2016-08-12 DIAGNOSIS — F419 Anxiety disorder, unspecified: Secondary | ICD-10-CM

## 2016-08-12 DIAGNOSIS — G44219 Episodic tension-type headache, not intractable: Secondary | ICD-10-CM

## 2016-08-12 DIAGNOSIS — F329 Major depressive disorder, single episode, unspecified: Secondary | ICD-10-CM

## 2016-08-12 DIAGNOSIS — Z3041 Encounter for surveillance of contraceptive pills: Secondary | ICD-10-CM

## 2016-08-12 MED ORDER — AMPHETAMINE-DEXTROAMPHETAMINE 20 MG PO TABS: 20.0000 mg | ORAL_TABLET | Freq: Three times a day (TID) | ORAL | 0 refills | Status: DC

## 2016-08-12 MED ORDER — GABAPENTIN 100 MG PO CAPS: ORAL_CAPSULE | ORAL | 3 refills | Status: DC

## 2016-08-12 MED ORDER — METOPROLOL SUCCINATE ER 25 MG PO TB24: ORAL_TABLET | ORAL | 3 refills | Status: DC

## 2016-08-12 MED ORDER — NORGESTIMATE-ETH ESTRADIOL 0.25-35 MG-MCG PO TABS: 1.0000 | ORAL_TABLET | Freq: Every day | ORAL | 4 refills | Status: DC

## 2016-08-12 MED ORDER — VENLAFAXINE HCL ER 150 MG PO CP24: ORAL_CAPSULE | ORAL | 3 refills | Status: DC

## 2016-08-12 MED ORDER — CYCLOBENZAPRINE HCL 5 MG PO TABS: 5.0000 mg | ORAL_TABLET | Freq: Three times a day (TID) | ORAL | 0 refills | Status: DC | PRN

## 2016-08-12 NOTE — Patient Instructions (Signed)
     IF you received an x-ray today, you will receive an invoice from [facility]. Please contact [facility] at [phone] with questions or concerns regarding your invoice.   IF you received labwork today, you will receive an invoice from [facility]. Please contact [facility] at [phone] with questions or concerns regarding your invoice.   Our billing staff will not be able to assist you with questions regarding bills from these companies.  You will be contacted with the lab results as soon as they are available. The fastest way to get your results is to activate your My Chart account. Instructions are located on the last page of this paperwork. If you have not heard from us regarding the results in 2 weeks, please contact this office.     

## 2016-08-12 NOTE — Progress Notes (Signed)
Michele Barker  MRN: [id number] DOB: 1985-12-14  PCP: Morrell RiddleWeber, Ermine Spofford L, PA-C  Chief Complaint  Patient presents with  . Medication Refill    adderall, effexor and BC, neurontin    Subjective:  Pt presents to clinic for medication refills.  She is doing well.  She has not been taking her toprol due to financial issues.  She has had trouble with her affording her medications.  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  She uses [facility] for her medications.  She is getting ready to have her fiance son move in with her and they have really been trying to cut cost.  She has stopped the remeron - she finds that she is able to lose weight a little better on that and she finds that with the gabapentin at night she does not need it and is sleeping well without it.  Review of Systems  Constitutional: Negative for appetite change, chills, fever and unexpected weight change.  Cardiovascular: Negative for chest pain and palpitations.  Psychiatric/Behavioral: Negative for sleep disturbance.    Patient Active Problem List   Diagnosis Date Noted  . ADD (attention deficit disorder) 01/20/2013  . Obesity 12/04/2011  . GERD (gastroesophageal reflux disease) 12/04/2011  . Tobacco abuse 12/04/2011  . Asthma 12/04/2011  . Anxiety and depression     Current Outpatient Prescriptions on File Prior to Visit  Medication Sig Dispense Refill  . albuterol (PROAIR HFA) 108 (90 BASE) MCG/ACT inhaler Inhale 2 puffs into the lungs every 4 (four) hours as needed for wheezing. 1 Inhaler 1   No current facility-administered medications on file prior to visit.     No Known Allergies  Pt patients past, family and social history were reviewed and updated.   Objective:  BP 132/88   Pulse (!) 108   Temp 98.7 F (37.1 C) (Oral)   Resp 18   Ht 5' 6.5"  (1.689 m)   Wt 248 lb 6.4 oz (112.7 kg)   LMP 08/12/2016   SpO2 97%   BMI 39.49 kg/m   Physical Exam  Constitutional: She is oriented to person, place, and time and well-developed, well-nourished, and in no distress.  HENT:  Head: Normocephalic and atraumatic.  Right Ear: Hearing and external ear normal.  Left Ear: Hearing and external ear normal.  Eyes: Conjunctivae are normal.  Neck: Normal range of motion.  Pulmonary/Chest: Effort normal.  Neurological: She is alert and oriented to person, place, and time. Gait normal.  Skin: Skin is warm and dry.  Psychiatric: Mood, memory, affect and judgment normal.  Vitals reviewed.   Assessment and Plan :  Episodic tension-type headache, not intractable - Plan: cyclobenzaprine (FLEXERIL) 5 MG tablet  Anxiety and depression - Plan: amphetamine-dextroamphetamine (ADDERALL) 20 MG tablet, amphetamine-dextroamphetamine (ADDERALL) 20 MG tablet, amphetamine-dextroamphetamine (ADDERALL) 20 MG tablet, venlafaxine XR (EFFEXOR XR) 150 MG 24 hr capsule, gabapentin (NEURONTIN) 100 MG capsule, metoprolol succinate (TOPROL-XL) 25 MG 24 hr tablet  Encounter for surveillance of contraceptive pills - Plan: norgestimate-ethinyl estradiol (MONONESSA) 0.25-35 MG-MCG tablet  Insomnia, unspecified type   Continue current medications - we went through and found that there are pharmacies where her medications are cheaper and her Rx were printed and she will do research on goodrx.com to help with her finances.  We did discuss that she has  to take the toprol esp with her adderall usage.  She agrees - recheck in 6 months  Contact me in 3 months for Rx  Benny Lennert PA-C  Primary Care at [facility] 08/12/2016 4:15 PM

## 2016-10-24 ENCOUNTER — Emergency Department (HOSPITAL_BASED_OUTPATIENT_CLINIC_OR_DEPARTMENT_OTHER): Payer: Self-pay

## 2016-10-24 ENCOUNTER — Encounter (HOSPITAL_BASED_OUTPATIENT_CLINIC_OR_DEPARTMENT_OTHER): Payer: Self-pay | Admitting: Emergency Medicine

## 2016-10-24 ENCOUNTER — Emergency Department (HOSPITAL_BASED_OUTPATIENT_CLINIC_OR_DEPARTMENT_OTHER)
Admission: EM | Admit: 2016-10-24 | Discharge: 2016-10-24 | Disposition: A | Discharge status: Home / Self Care | Payer: Self-pay | Attending: Emergency Medicine | Admitting: Emergency Medicine

## 2016-10-24 DIAGNOSIS — W06XXXA Fall from bed, initial encounter: Secondary | ICD-10-CM | POA: Insufficient documentation

## 2016-10-24 DIAGNOSIS — Y9389 Activity, other specified: Secondary | ICD-10-CM | POA: Insufficient documentation

## 2016-10-24 DIAGNOSIS — Y92013 Bedroom of single-family (private) house as the place of occurrence of the external cause: Secondary | ICD-10-CM | POA: Insufficient documentation

## 2016-10-24 DIAGNOSIS — Z79899 Other long term (current) drug therapy: Secondary | ICD-10-CM | POA: Insufficient documentation

## 2016-10-24 DIAGNOSIS — F17210 Nicotine dependence, cigarettes, uncomplicated: Secondary | ICD-10-CM | POA: Insufficient documentation

## 2016-10-24 DIAGNOSIS — S8992XA Unspecified injury of left lower leg, initial encounter: Secondary | ICD-10-CM | POA: Insufficient documentation

## 2016-10-24 DIAGNOSIS — Y998 Other external cause status: Secondary | ICD-10-CM | POA: Insufficient documentation

## 2016-10-24 DIAGNOSIS — S8991XA Unspecified injury of right lower leg, initial encounter: Secondary | ICD-10-CM | POA: Insufficient documentation

## 2016-10-24 DIAGNOSIS — J45909 Unspecified asthma, uncomplicated: Secondary | ICD-10-CM | POA: Insufficient documentation

## 2016-10-24 MED ORDER — HYDROCODONE-ACETAMINOPHEN 5-325 MG PO TABS
1.0000 | ORAL_TABLET | Freq: Once | ORAL | Status: AC
  Administered 2016-10-24: 1 via ORAL
  Filled 2016-10-24: qty 1

## 2016-10-24 NOTE — ED Notes (Signed)
Pt verbalizes understanding of d/c instructions and denies any further needs at this time. 

## 2016-10-24 NOTE — ED Notes (Signed)
Updated to wait time 

## 2016-10-24 NOTE — ED Provider Notes (Signed)
[facility] Note   CSN: [id number] Arrival date & time: 10/24/16  1835     History   Chief Complaint Chief Complaint  Patient presents with  . Fall    HPI Michele Barker is a 31 y.o. female w PMHx Anxiety, depression, asthma, GERD, presenting to the ED with acute onset of bilateral knee pain, right worse than left. Patient states pain began on Monday evening when she was sleeping, she rolled out of bed hitting her right knee on the wall molding. She states since that time her knee pain has been worsening, not relieved with Advil. Localizes pain in right knee to anterior and posterior aspects. Left knee with pain to anterior aspect. Both knee pain is made worse with ambulation. Denies head trauma or LOC, numbness or tingling, or any other complaints today.  The history is provided by the patient.    Past Medical History:  Diagnosis Date  . Allergy   . Anxiety   . Anxiety and depression   . Asthma   . Depression   . GERD (gastroesophageal reflux disease)     Patient Active Problem List   Diagnosis Date Noted  . ADD (attention deficit disorder) 01/20/2013  . Obesity 12/04/2011  . GERD (gastroesophageal reflux disease) 12/04/2011  . Tobacco abuse 12/04/2011  . Asthma 12/04/2011  . Anxiety and depression     Past Surgical History:  Procedure Laterality Date  . extraction of wisdom teeth      OB History    No data available       Home Medications    Prior to Admission medications   Medication Sig Start Date End Date Taking? Authorizing Provider  albuterol (PROAIR HFA) 108 (90 BASE) MCG/ACT inhaler Inhale 2 puffs into the lungs every 4 (four) hours as needed for wheezing. 03/04/14   Weber, Dema Severin, PA-C  amphetamine-dextroamphetamine (ADDERALL) 20 MG tablet Take 1 tablet (20 mg total) by mouth 3 (three) times daily. 08/12/16   Weber, Dema Severin, PA-C  amphetamine-dextroamphetamine (ADDERALL) 20 MG tablet Take 1 tablet (20 mg total) by mouth 3 (three)  times daily. 08/12/16   Weber, Dema Severin, PA-C  amphetamine-dextroamphetamine (ADDERALL) 20 MG tablet Take 1 tablet (20 mg total) by mouth 3 (three) times daily. 08/12/16   Valarie Cones, Dema Severin, PA-C  cyclobenzaprine (FLEXERIL) 5 MG tablet Take 1 tablet (5 mg total) by mouth 3 (three) times daily as needed for muscle spasms. 08/12/16   Valarie Cones, Dema Severin, PA-C  gabapentin (NEURONTIN) 100 MG capsule 1 po qam, 2 po qhs 08/12/16   Weber, Sarah L, PA-C  metoprolol succinate (TOPROL-XL) 25 MG 24 hr tablet TAKE 1 TABLET(25 MG) BY MOUTH DAILY 08/12/16   Weber, Dema Severin, PA-C  norgestimate-ethinyl estradiol (MONONESSA) 0.25-35 MG-MCG tablet Take 1 tablet by mouth daily. 08/12/16   Weber, Dema Severin, PA-C  venlafaxine XR (EFFEXOR XR) 150 MG 24 hr capsule TAKE 2 CAPSULES BY MOUTH EVERY DAY WITH BREAKFAST. 08/12/16   Valarie Cones Dema Severin, PA-C    Family History Family History  Problem Relation Age of Onset  . Depression Mother        bipolar  . Hypothyroidism Mother   . Bipolar disorder Mother   . Hyperlipidemia Mother   . Heart disease Father   . Hyperlipidemia Father   . Hypertension Father   . Gout Maternal Grandfather   . Depression Sister        bipolar  . Hypothyroidism Sister   . Bipolar disorder Sister   .  Emphysema Maternal Grandmother   . Kidney disease Paternal Grandfather   . Hypertension Sister   . Learning disabilities Sister   . Coronary artery disease Other     Social History Social History  Substance Use Topics  . Smoking status: Current Every Day Smoker    Packs/day: 0.50    Years: 10.00    Types: Cigarettes  . Smokeless tobacco: Never Used  . Alcohol use No     Comment: rare     Allergies   Patient has no known allergies.   Review of Systems Review of Systems  Constitutional: Negative for fever.  Musculoskeletal: Positive for arthralgias and joint swelling.  Skin: Positive for color change.  Neurological: Negative for numbness.     Physical Exam Updated Vital Signs BP 126/87  (BP Location: Right Arm)   Pulse 100   Temp 98.7 F (37.1 C) (Oral)   Resp 18   Ht 5\' 7"  (1.702 m)   Wt 113.4 kg (250 lb)   LMP 10/03/2016   SpO2 100%   BMI 39.16 kg/m   Physical Exam  Constitutional: She appears well-developed and well-nourished. No distress.  HENT:  Head: Normocephalic and atraumatic.  Eyes: Conjunctivae are normal.  Cardiovascular: Normal rate and intact distal pulses.   Pulmonary/Chest: Effort normal.  Musculoskeletal:  Right anterior knee with old bruise over her patella. Diffuse tenderness over patella and posteriorly. Negative anterior and posterior drawer, negative valgus and varus. Knee is stable without edema, erythema or warmth. Left knee with tenderness over anterior aspect. Negative anterior posterior drawer, negative valgus and varus. Knee is stable without edema, erythema or warmth.  Neurological:  Normal Sensation. Normal gait.  Psychiatric: She has a normal mood and affect. Her behavior is normal.  Nursing note and vitals reviewed.    ED Treatments / Results  Labs (all labs ordered are listed, but only abnormal results are displayed) Labs Reviewed - No data to display  EKG  EKG Interpretation None       Radiology Dg Knee Complete 4 Views Left  Result Date: 10/24/2016 CLINICAL DATA:  31 year old female with history of trauma after falling out of bed complaining of pain in both knees. EXAM: LEFT KNEE - COMPLETE 4+ VIEW COMPARISON:  No priors. FINDINGS: No evidence of fracture, dislocation, or joint effusion. No evidence of arthropathy or other focal bone abnormality. Soft tissues are unremarkable. IMPRESSION: Negative. Electronically Signed   By: Trudie Reedaniel  Entrikin M.D.   On: 10/24/2016 19:09   Dg Knee Complete 4 Views Right  Result Date: 10/24/2016 CLINICAL DATA:  31 year old female with history of trauma after falling out of bed complaining of pain in both knees. EXAM: RIGHT KNEE - COMPLETE 4+ VIEW COMPARISON:  No priors. FINDINGS: No  evidence of fracture, dislocation, or joint effusion. No evidence of arthropathy or other focal bone abnormality. Soft tissues are unremarkable. IMPRESSION: Negative. Electronically Signed   By: Trudie Reedaniel  Entrikin M.D.   On: 10/24/2016 19:10    Procedures Procedures (including critical care time)  Medications Ordered in ED Medications  HYDROcodone-acetaminophen (NORCO/VICODIN) 5-325 MG per tablet 1 tablet (not administered)     Initial Impression / Assessment and Plan / ED Course  I have reviewed the triage vital signs and the nursing notes.  Pertinent labs & imaging results that were available during my care of the patient were reviewed by me and considered in my medical decision making (see chart for details).     Patient with bilateral knee contusions and pain since Monday  status post falling out of bed. NV intact. X-Rays negative for obvious fracture or dislocation. Knees are stable. Pain managed in ED. Pt advised to follow up with orthopedics if symptoms persist. Patient given brace while in ED, conservative therapy recommended and discussed. Patient will be dc home & is agreeable with above plan.  Discussed results, findings, treatment and follow up. Patient advised of return precautions. Patient verbalized understanding and agreed with plan.  Final Clinical Impressions(s) / ED Diagnoses   Final diagnoses:  Right knee injury, initial encounter  Left knee injury, initial encounter    New Prescriptions New Prescriptions   No medications on file     Russo, [place] N, PA-C 10/24/16 2148    Russo, [place] N, PA-C 10/24/16 2148    Loren Racer, MD 10/26/16 [phone]

## 2016-10-24 NOTE — Discharge Instructions (Signed)
Please read instructions below. Apply ice to your knees for 20 minutes at a time. Elevate when possible. You can take tylenol every 6 hours as needed for pain. Schedule an appointment with the orthopedic specialist in 3 days for follow-up on your injuries if symptoms persist. Return to the ER for new or concerning symptoms.

## 2016-10-24 NOTE — ED Triage Notes (Signed)
Patient states that she had a bad dream on Monday night and fell out of bed. She reports that her right knee started to hurt then. Now she has pain to both knees and is having trouble walking

## 2016-11-06 ENCOUNTER — Other Ambulatory Visit: Payer: Self-pay | Admitting: Physician Assistant

## 2016-11-06 DIAGNOSIS — G44219 Episodic tension-type headache, not intractable: Secondary | ICD-10-CM

## 2016-11-06 DIAGNOSIS — F419 Anxiety disorder, unspecified: Secondary | ICD-10-CM

## 2016-11-06 DIAGNOSIS — F329 Major depressive disorder, single episode, unspecified: Secondary | ICD-10-CM

## 2016-11-07 MED ORDER — AMPHETAMINE-DEXTROAMPHETAMINE 20 MG PO TABS: 20.0000 mg | ORAL_TABLET | Freq: Three times a day (TID) | ORAL | 0 refills | Status: DC

## 2016-11-07 MED ORDER — CYCLOBENZAPRINE HCL 5 MG PO TABS: 5.0000 mg | ORAL_TABLET | Freq: Three times a day (TID) | ORAL | 0 refills | Status: DC | PRN

## 2016-11-07 NOTE — Telephone Encounter (Signed)
Done - pt knows through mychart but she needs to pick up at [facility] this weekend

## 2016-11-08 NOTE — Telephone Encounter (Signed)
Pt was called an informed to pick up Rx.

## 2017-02-11 ENCOUNTER — Other Ambulatory Visit: Payer: Self-pay | Admitting: Physician Assistant

## 2017-02-11 DIAGNOSIS — F329 Major depressive disorder, single episode, unspecified: Secondary | ICD-10-CM

## 2017-02-11 DIAGNOSIS — F419 Anxiety disorder, unspecified: Principal | ICD-10-CM

## 2017-02-15 MED ORDER — AMPHETAMINE-DEXTROAMPHETAMINE 20 MG PO TABS: 20.0000 mg | ORAL_TABLET | Freq: Three times a day (TID) | ORAL | 0 refills | Status: DC

## 2017-02-15 NOTE — Telephone Encounter (Signed)
Patient notified via My Chart.  Rx sent electronically.  Meds ordered this encounter  Medications  . amphetamine-dextroamphetamine (ADDERALL) 20 MG tablet    Sig: Take 1 tablet (20 mg total) by mouth 3 (three) times daily.    Dispense:  90 tablet    Refill:  0   Patient advised she needs visit for more.

## 2017-03-17 ENCOUNTER — Other Ambulatory Visit: Payer: Self-pay | Admitting: Physician Assistant

## 2017-03-17 DIAGNOSIS — F419 Anxiety disorder, unspecified: Principal | ICD-10-CM

## 2017-03-17 DIAGNOSIS — F329 Major depressive disorder, single episode, unspecified: Secondary | ICD-10-CM

## 2017-03-17 MED ORDER — AMPHETAMINE-DEXTROAMPHETAMINE 20 MG PO TABS: 20.0000 mg | ORAL_TABLET | Freq: Three times a day (TID) | ORAL | 0 refills | Status: DC

## 2017-03-27 ENCOUNTER — Ambulatory Visit: Payer: Self-pay | Admitting: Physician Assistant

## 2017-03-27 ENCOUNTER — Encounter: Payer: Self-pay | Admitting: Physician Assistant

## 2017-03-27 ENCOUNTER — Other Ambulatory Visit: Payer: Self-pay

## 2017-03-27 VITALS — BP 118/88 | HR 92 | Temp 98.5°F | Resp 18 | Ht 67.0 in | Wt 228.6 lb

## 2017-03-27 DIAGNOSIS — F419 Anxiety disorder, unspecified: Secondary | ICD-10-CM

## 2017-03-27 DIAGNOSIS — G44219 Episodic tension-type headache, not intractable: Secondary | ICD-10-CM

## 2017-03-27 DIAGNOSIS — F900 Attention-deficit hyperactivity disorder, predominantly inattentive type: Secondary | ICD-10-CM

## 2017-03-27 DIAGNOSIS — F32A Depression, unspecified: Secondary | ICD-10-CM

## 2017-03-27 DIAGNOSIS — Z3041 Encounter for surveillance of contraceptive pills: Secondary | ICD-10-CM

## 2017-03-27 DIAGNOSIS — F329 Major depressive disorder, single episode, unspecified: Secondary | ICD-10-CM

## 2017-03-27 MED ORDER — METOPROLOL SUCCINATE ER 25 MG PO TB24: ORAL_TABLET | ORAL | 3 refills | Status: AC

## 2017-03-27 MED ORDER — AMPHETAMINE-DEXTROAMPHETAMINE 20 MG PO TABS: 20.0000 mg | ORAL_TABLET | Freq: Three times a day (TID) | ORAL | 0 refills | Status: DC

## 2017-03-27 MED ORDER — VENLAFAXINE HCL ER 150 MG PO CP24: ORAL_CAPSULE | ORAL | 3 refills | Status: AC

## 2017-03-27 MED ORDER — NORGESTIMATE-ETH ESTRADIOL 0.25-35 MG-MCG PO TABS: 1.0000 | ORAL_TABLET | Freq: Every day | ORAL | 4 refills | Status: DC

## 2017-03-27 MED ORDER — GABAPENTIN 100 MG PO CAPS: ORAL_CAPSULE | ORAL | 3 refills | Status: AC

## 2017-03-27 MED ORDER — CYCLOBENZAPRINE HCL 5 MG PO TABS: 5.0000 mg | ORAL_TABLET | Freq: Three times a day (TID) | ORAL | 0 refills | Status: DC | PRN

## 2017-03-27 NOTE — Patient Instructions (Addendum)
[facility]    IF you received an x-ray today, you will receive an [facility] from [facility]. Please contact [facility] at [phone] with questions or concerns regarding your invoice.   IF you received labwork today, you will receive an invoice from [facility]. Please contact [facility] at [phone] with questions or concerns regarding your invoice.   Our billing staff will not be able to assist you with questions regarding bills from these companies.  You will be contacted with the lab results as soon as they are available. The fastest way to get your results is to activate your My Chart account. Instructions are located on the last page of this paperwork. If you have not heard from us regarding the results in 2 weeks, please contact this office.

## 2017-03-27 NOTE — Progress Notes (Signed)
Michele Barker J Blacksher  MRN: [id number] DOB: 1986-02-08  PCP: Morrell RiddleWeber, Geremiah Fussell L, PA-C  Chief Complaint  Patient presents with  . Medication Refill    adderall here to dicuss medications     Subjective:  Pt presents to clinic for medication refills.  She is doing really good.  Her anxiety is better than it has been in a really long time.  She is happy with her medications.  Her weight loss has been because of eating changes and adding exercise (walking)   History is obtained by patient.  Review of Systems  Constitutional: Negative for appetite change, chills, fever and unexpected weight change.  Cardiovascular: Negative for chest pain and palpitations.  Psychiatric/Behavioral: Negative for sleep disturbance.    Patient Active Problem List   Diagnosis Date Noted  . ADD (attention deficit disorder) - wellcontrolled 01/20/2013  . Obesity - significant improvement in weight 12/04/2011  . GERD (gastroesophageal reflux disease) - much better with weight loss - no problems in greater than 6 months 12/04/2011  . Tobacco abuse - 1/2 pack per day 12/04/2011  . Asthma - no problems 12/04/2011  . Anxiety and depression - well controlled     Current Outpatient Medications on File Prior to Visit  Medication Sig Dispense Refill  . albuterol (PROAIR HFA) 108 (90 BASE) MCG/ACT inhaler Inhale 2 puffs into the lungs every 4 (four) hours as needed for wheezing. 1 Inhaler 1   No current facility-administered medications on file prior to visit.     No Known Allergies  Past Medical History:  Diagnosis Date  . Allergy   . Anxiety   . Anxiety and depression   . Asthma   . Depression   . GERD (gastroesophageal reflux disease)    Social History   Social History Narrative   Engineer, agriculturalMarketing manager   Significant other   Education: College   Exercise: Yes   Social History   Tobacco Use  . Smoking status: Current Every Day Smoker    Packs/day: 0.50    Years: 10.00    Pack years: 5.00    Types:  Cigarettes  . Smokeless tobacco: Never Used  Substance Use Topics  . Alcohol use: No    Alcohol/week: 0.0 oz    Comment: rare  . Drug use: No   family history includes Bipolar disorder in her mother and sister; Coronary artery disease in her other; Depression in her mother and sister; Emphysema in her maternal grandmother; Gout in her maternal grandfather; Heart disease in her father; Hyperlipidemia in her father and mother; Hypertension in her father and sister; Hypothyroidism in her mother and sister; Kidney disease in her paternal grandfather; Learning disabilities in her sister.     Objective:  BP 118/88   Pulse 92   Temp 98.5 F (36.9 C) (Oral)   Resp 18   Ht 5\' 7"  (1.702 m)   Wt 228 lb 9.6 oz (103.7 kg)   LMP 03/24/2017   SpO2 98%   BMI 35.80 kg/m  Body mass index is 35.8 kg/m.  Physical Exam  Constitutional: She is oriented to person, place, and time and well-developed, well-nourished, and in no distress.  HENT:  Head: Normocephalic and atraumatic.  Right Ear: Hearing and external ear normal.  Left Ear: Hearing and external ear normal.  Eyes: Conjunctivae are normal.  Neck: Normal range of motion.  Cardiovascular: Normal rate, regular rhythm and normal heart sounds.  No murmur heard. Pulmonary/Chest: Effort normal and breath sounds normal. She has no wheezes.  Neurological: She is alert and oriented to person, place, and time. Gait normal.  Skin: Skin is warm and dry.  Psychiatric: Mood, memory, affect and judgment normal.  Vitals reviewed.   Assessment and Plan :  Attention deficit hyperactivity disorder (ADHD), predominantly inattentive type - Plan: amphetamine-dextroamphetamine (ADDERALL) 20 MG tablet, amphetamine-dextroamphetamine (ADDERALL) 20 MG tablet, amphetamine-dextroamphetamine (ADDERALL) 20 MG tablet - well controlled  Anxiety and depression - Plan: venlafaxine XR (EFFEXOR XR) 150 MG 24 hr capsule, metoprolol succinate (TOPROL-XL) 25 MG 24 hr tablet,  gabapentin (NEURONTIN) 100 MG capsule - well controlled  Episodic tension-type headache, not intractable - Plan: cyclobenzaprine (FLEXERIL) 5 MG tablet - take it only a few times a month - helps with headaches when she needs it  Encounter for surveillance of contraceptive pills - Plan: norgestimate-ethinyl estradiol (MONONESSA) 0.25-35 MG-MCG tablet - pt has no insurance and she has not had a pap smear in years - she will plan on going to [facility] or [facility] that will work on a sliding scale to have this done.  Benny Lennert PA-C  Primary Care at [facility] 03/27/2017 1:53 PM

## 2017-07-14 ENCOUNTER — Other Ambulatory Visit: Payer: Self-pay | Admitting: Physician Assistant

## 2017-07-14 DIAGNOSIS — G44219 Episodic tension-type headache, not intractable: Secondary | ICD-10-CM

## 2017-07-14 DIAGNOSIS — F900 Attention-deficit hyperactivity disorder, predominantly inattentive type: Secondary | ICD-10-CM

## 2017-07-16 MED ORDER — AMPHETAMINE-DEXTROAMPHETAMINE 20 MG PO TABS: 20.0000 mg | ORAL_TABLET | Freq: Three times a day (TID) | ORAL | 0 refills | Status: DC

## 2017-07-16 MED ORDER — AMPHETAMINE-DEXTROAMPHETAMINE 20 MG PO TABS: 20.0000 mg | ORAL_TABLET | Freq: Three times a day (TID) | ORAL | 0 refills | Status: AC

## 2017-07-16 MED ORDER — CYCLOBENZAPRINE HCL 5 MG PO TABS: 5.0000 mg | ORAL_TABLET | Freq: Three times a day (TID) | ORAL | 0 refills | Status: AC | PRN

## 2017-07-16 NOTE — Telephone Encounter (Signed)
Done

## 2017-10-13 ENCOUNTER — Other Ambulatory Visit: Payer: Self-pay | Admitting: Physician Assistant

## 2017-10-13 DIAGNOSIS — F900 Attention-deficit hyperactivity disorder, predominantly inattentive type: Secondary | ICD-10-CM

## 2017-10-14 ENCOUNTER — Encounter: Payer: Self-pay | Admitting: Physician Assistant

## 2017-10-14 DIAGNOSIS — Z3041 Encounter for surveillance of contraceptive pills: Secondary | ICD-10-CM

## 2017-10-14 MED ORDER — AMPHETAMINE-DEXTROAMPHETAMINE 20 MG PO TABS: 20.0000 mg | ORAL_TABLET | Freq: Three times a day (TID) | ORAL | 0 refills | Status: AC

## 2017-10-14 NOTE — Telephone Encounter (Signed)
Patient is requesting a refill of the following medications: Requested Prescriptions   Pending Prescriptions Disp Refills  . amphetamine-dextroamphetamine (ADDERALL) 20 MG tablet 90 tablet 0    Sig: Take 1 tablet (20 mg total) by mouth 3 (three) times daily.    Date of patient request: 10/13/2017 Last office visit: 03/27/2017 Date of last refill: 07/16/2017 Last refill amount: 90 Follow up time period per chart:

## 2017-10-15 MED ORDER — NORGESTIMATE-ETH ESTRADIOL 0.25-35 MG-MCG PO TABS: 1.0000 | ORAL_TABLET | Freq: Every day | ORAL | 4 refills | Status: AC
